# Patient Record
Sex: Female | Born: 1955 | Race: White | Hispanic: No | Smoking: Never smoker
Health system: Southern US, Community
[De-identification: ages and names within clinical notes are randomized; demographics above are authoritative.]

## PROBLEM LIST (undated history)

## (undated) DIAGNOSIS — C801 Malignant (primary) neoplasm, unspecified: Secondary | ICD-10-CM

## (undated) HISTORY — PX: SKIN BIOPSY: SHX1

---

## 2006-07-21 ENCOUNTER — Ambulatory Visit: Payer: Self-pay

## 2010-06-25 ENCOUNTER — Ambulatory Visit: Payer: Self-pay

## 2013-06-26 ENCOUNTER — Ambulatory Visit: Payer: Self-pay | Admitting: Gastroenterology

## 2013-06-27 LAB — PATHOLOGY REPORT

## 2013-07-21 ENCOUNTER — Ambulatory Visit: Payer: Self-pay | Admitting: Unknown Physician Specialty

## 2015-05-28 ENCOUNTER — Other Ambulatory Visit: Payer: Self-pay | Admitting: Unknown Physician Specialty

## 2015-05-28 DIAGNOSIS — R928 Other abnormal and inconclusive findings on diagnostic imaging of breast: Secondary | ICD-10-CM

## 2015-05-31 ENCOUNTER — Ambulatory Visit
Admission: RE | Admit: 2015-05-31 | Discharge: 2015-05-31 | Disposition: A | Discharge status: Home / Self Care | Payer: BC Managed Care – PPO | Source: Ambulatory Visit | Attending: Unknown Physician Specialty | Admitting: Unknown Physician Specialty

## 2015-05-31 DIAGNOSIS — N6002 Solitary cyst of left breast: Secondary | ICD-10-CM | POA: Diagnosis not present

## 2015-05-31 DIAGNOSIS — R928 Other abnormal and inconclusive findings on diagnostic imaging of breast: Secondary | ICD-10-CM

## 2015-05-31 HISTORY — DX: Malignant (primary) neoplasm, unspecified: C80.1

## 2016-06-29 ENCOUNTER — Other Ambulatory Visit: Payer: Self-pay | Admitting: Internal Medicine

## 2016-06-30 ENCOUNTER — Other Ambulatory Visit: Payer: Self-pay | Admitting: Obstetrics and Gynecology

## 2016-06-30 DIAGNOSIS — N6320 Unspecified lump in the left breast, unspecified quadrant: Secondary | ICD-10-CM

## 2016-07-15 ENCOUNTER — Ambulatory Visit
Admission: RE | Admit: 2016-07-15 | Discharge: 2016-07-15 | Disposition: A | Discharge status: Home / Self Care | Payer: BC Managed Care – PPO | Source: Ambulatory Visit | Attending: Obstetrics and Gynecology | Admitting: Obstetrics and Gynecology

## 2016-07-15 DIAGNOSIS — N63 Unspecified lump in breast: Secondary | ICD-10-CM | POA: Insufficient documentation

## 2016-07-15 DIAGNOSIS — N6320 Unspecified lump in the left breast, unspecified quadrant: Secondary | ICD-10-CM

## 2017-06-07 ENCOUNTER — Other Ambulatory Visit: Payer: Self-pay | Admitting: Obstetrics and Gynecology

## 2017-06-07 DIAGNOSIS — Z1231 Encounter for screening mammogram for malignant neoplasm of breast: Secondary | ICD-10-CM

## 2017-07-27 ENCOUNTER — Ambulatory Visit
Admission: RE | Admit: 2017-07-27 | Discharge: 2017-07-27 | Disposition: A | Discharge status: Home / Self Care | Payer: BC Managed Care – PPO | Source: Ambulatory Visit | Attending: Obstetrics and Gynecology | Admitting: Obstetrics and Gynecology

## 2017-07-27 DIAGNOSIS — Z1231 Encounter for screening mammogram for malignant neoplasm of breast: Secondary | ICD-10-CM | POA: Insufficient documentation

## 2017-07-28 ENCOUNTER — Encounter: Payer: Self-pay | Admitting: Obstetrics and Gynecology

## 2017-08-04 ENCOUNTER — Ambulatory Visit: Payer: Self-pay | Admitting: Obstetrics and Gynecology

## 2017-10-11 ENCOUNTER — Encounter: Payer: Self-pay | Admitting: Obstetrics and Gynecology

## 2017-10-11 ENCOUNTER — Ambulatory Visit (INDEPENDENT_AMBULATORY_CARE_PROVIDER_SITE_OTHER): Payer: BC Managed Care – PPO | Admitting: Obstetrics and Gynecology

## 2017-10-11 VITALS — BP 110/70 | Ht 67.0 in | Wt 163.0 lb

## 2017-10-11 DIAGNOSIS — Z1231 Encounter for screening mammogram for malignant neoplasm of breast: Secondary | ICD-10-CM

## 2017-10-11 DIAGNOSIS — L290 Pruritus ani: Secondary | ICD-10-CM | POA: Diagnosis not present

## 2017-10-11 DIAGNOSIS — N952 Postmenopausal atrophic vaginitis: Secondary | ICD-10-CM | POA: Diagnosis not present

## 2017-10-11 DIAGNOSIS — Z01419 Encounter for gynecological examination (general) (routine) without abnormal findings: Secondary | ICD-10-CM

## 2017-10-11 DIAGNOSIS — Z1239 Encounter for other screening for malignant neoplasm of breast: Secondary | ICD-10-CM

## 2017-10-11 NOTE — Progress Notes (Signed)
PCP: Patient, No Pcp Per   Chief Complaint  Patient presents with  . Gynecologic Exam    HPI:      [name] is a 61 y.o. [phone] who LMP was No LMP recorded. Patient is postmenopausal., presents today for her annual examination.  Her menses are absent due to menopause.  She does not have intermenstrual bleeding.  She does have vasomotor sx--occas night sweats.   Sex activity: not sexually active. She does not have vaginal dryness.  Last Pap: May 24, 2015  Results were: no abnormalities /neg HPV DNA.  Hx of STDs: none  Last mammogram: July 27, 2017  Results were: normal--routine follow-up in 12 months There is a FH of breast cancer in her mother, genetic testing not indicated. There is no FH of ovarian cancer. The patient does do self-breast exams.  Colonoscopy: colonoscopy 4 years ago with abnormalities. . Repeat due after 5 years.    Tobacco use: The patient denies current or previous tobacco use. Alcohol use: social drinker Exercise: very active--runs 1/2 marathons  She does get adequate calcium but not Vitamin D in her diet.  She has occas perirectal dryness and itching and uses lotrisone sparingly because too much cream causes bleeding. She is a runner and often in damp underwear.    Past Medical History:  Diagnosis Date  . Cancer [facility])    skin cancer    History reviewed. No pertinent surgical history.  Family History  Problem Relation Age of Onset  . Breast cancer Mother 83  . Skin cancer Mother   . Skin cancer Father   . Throat cancer Maternal Grandfather     Social History   Socioeconomic History  . Marital status: Divorced    Spouse name: Not on file  . Number of children: Not on file  . Years of education: Not on file  . Highest education level: Not on file  Social Needs  . Financial resource strain: Not on file  . Food insecurity - worry: Not on file  . Food insecurity - inability: Not on file  . Transportation needs -  medical: Not on file  . Transportation needs - non-medical: Not on file  Occupational History  . Not on file  Tobacco Use  . Smoking status: Never Smoker  . Smokeless tobacco: Never Used  Substance and Sexual Activity  . Alcohol use: Yes  . Drug use: No  . Sexual activity: No  Other Topics Concern  . Not on file  Social History Narrative  . Not on file    Current Meds  Medication Sig  . triamcinolone (NASACORT ALLERGY 24HR) 55 MCG/ACT AERO nasal inhaler Place into the nose.      ROS:  Review of Systems  Constitutional: Negative for fatigue, fever and unexpected weight change.  Respiratory: Negative for cough, shortness of breath and wheezing.   Cardiovascular: Negative for chest pain, palpitations and leg swelling.  Gastrointestinal: Negative for blood in stool, constipation, diarrhea, nausea and vomiting.  Endocrine: Negative for cold intolerance, heat intolerance and polyuria.  Genitourinary: Negative for dyspareunia, dysuria, flank pain, frequency, genital sores, hematuria, menstrual problem, pelvic pain, urgency, vaginal bleeding, vaginal discharge and vaginal pain.  Musculoskeletal: Negative for back pain, joint swelling and myalgias.  Skin: Negative for rash.  Neurological: Negative for dizziness, syncope, light-headedness, numbness and headaches.  Hematological: Negative for adenopathy.  Psychiatric/Behavioral: Negative for agitation, confusion, sleep disturbance and suicidal ideas. The patient is not nervous/anxious.  Objective: BP 110/70   Ht 5\' 7"  (1.702 m)   Wt 163 lb (73.9 kg)   BMI 25.53 kg/m    Physical Exam  Constitutional: She is oriented to person, place, and time. She appears well-developed and well-nourished.  Genitourinary: Vagina normal and uterus normal. There is no rash or tenderness on the right labia. There is no rash or tenderness on the left labia. No erythema or tenderness in the vagina. No vaginal discharge found. Right adnexum does  not display mass and does not display tenderness. Left adnexum does not display mass and does not display tenderness. Cervix does not exhibit motion tenderness or polyp. Uterus is not enlarged or tender.  Genitourinary Comments: MODERATE VAGINAL AND PERINEAL/PERIANAL ATROPHY OF TISSUE; FEW SUPERFICIAL FISSURE PERINEAL AREA  Neck: Normal range of motion. No thyromegaly present.  Cardiovascular: Normal rate, regular rhythm and normal heart sounds.  No murmur heard. Pulmonary/Chest: Effort normal and breath sounds normal. Right breast exhibits no mass, no nipple discharge, no skin change and no tenderness. Left breast exhibits no mass, no nipple discharge, no skin change and no tenderness.  Abdominal: Soft. There is no tenderness. There is no guarding.  Musculoskeletal: Normal range of motion.  Neurological: She is alert and oriented to person, place, and time. No cranial nerve deficit.  Psychiatric: She has a normal mood and affect. Her behavior is normal.  Vitals reviewed.   Assessment/Plan:  Encounter for annual routine gynecological examination  Screening for breast cancer - Pt is current on mammo (due 8/19). - Plan: MM DIGITAL SCREENING BILATERAL  Vaginal atrophy - Not symptomatic  Perianal itch - Most likely due to moisture from running and atrophy. Try clotrimazole only, coconut oil, keep area dry/desitin or A&D oint. Can try vag ERT ext prn.       GYN counsel breast self exam, mammography screening, adequate intake of calcium and vitamin D, diet and exercise    F/U  Return in about 1 year (around 10/11/2018).  Marquinn Meschke B. Noah Lembke, PA-C 10/11/2017 3:23 PM

## 2017-10-11 NOTE — Patient Instructions (Addendum)
I value your feedback and entrusting us with your care. If you get a [facility] patient survey, I would appreciate you taking the time to let us know about your experience today. Thank you!  [facility]: [phone]    

## 2018-11-17 ENCOUNTER — Ambulatory Visit: Payer: BC Managed Care – PPO | Admitting: Obstetrics and Gynecology

## 2018-12-02 HISTORY — PX: COLONOSCOPY: SHX174

## 2018-12-15 ENCOUNTER — Encounter: Payer: Self-pay | Admitting: Obstetrics and Gynecology

## 2018-12-15 ENCOUNTER — Other Ambulatory Visit (HOSPITAL_COMMUNITY)
Admission: RE | Admit: 2018-12-15 | Discharge: 2018-12-15 | Disposition: A | Discharge status: Home / Self Care | Payer: BC Managed Care – PPO | Source: Ambulatory Visit | Attending: Obstetrics and Gynecology | Admitting: Obstetrics and Gynecology

## 2018-12-15 ENCOUNTER — Ambulatory Visit (INDEPENDENT_AMBULATORY_CARE_PROVIDER_SITE_OTHER): Payer: BC Managed Care – PPO | Admitting: Obstetrics and Gynecology

## 2018-12-15 VITALS — BP 100/78 | HR 56 | Ht 67.0 in | Wt 162.0 lb

## 2018-12-15 DIAGNOSIS — Z01419 Encounter for gynecological examination (general) (routine) without abnormal findings: Secondary | ICD-10-CM | POA: Diagnosis not present

## 2018-12-15 DIAGNOSIS — Z1151 Encounter for screening for human papillomavirus (HPV): Secondary | ICD-10-CM | POA: Diagnosis not present

## 2018-12-15 DIAGNOSIS — N644 Mastodynia: Secondary | ICD-10-CM

## 2018-12-15 DIAGNOSIS — Z124 Encounter for screening for malignant neoplasm of cervix: Secondary | ICD-10-CM | POA: Insufficient documentation

## 2018-12-15 DIAGNOSIS — L290 Pruritus ani: Secondary | ICD-10-CM

## 2018-12-15 DIAGNOSIS — Z1239 Encounter for other screening for malignant neoplasm of breast: Secondary | ICD-10-CM | POA: Diagnosis not present

## 2018-12-15 DIAGNOSIS — Z1211 Encounter for screening for malignant neoplasm of colon: Secondary | ICD-10-CM

## 2018-12-15 MED ORDER — CLOTRIMAZOLE-BETAMETHASONE 1-0.05 % EX CREA: TOPICAL_CREAM | CUTANEOUS | 0 refills | Status: DC

## 2018-12-15 NOTE — Patient Instructions (Addendum)
I value your feedback and entrusting us with your care. If you get a [facility] patient survey, I would appreciate you taking the time to let us know about your experience today. Thank you!  [facility]: [phone]    

## 2018-12-15 NOTE — Progress Notes (Signed)
PCP: Patient, No Pcp Per   Chief Complaint  Patient presents with  . Gynecologic Exam    is having tenderness on right breast, no lump noticed x 1 month    HPI:      Heather Chase is a 63 y.o. [phone] who LMP was No LMP recorded. Patient is postmenopausal., presents today for her annual examination.  Her menses are absent due to menopause.  She does not have intermenstrual bleeding. She does not have vasomotor sx  Sex activity: not sexually active. She does not have vaginal dryness.  Last Pap: May 24, 2015  Results were: no abnormalities /neg HPV DNA.  Hx of STDs: none  Last mammogram: July 27, 2017  Results were: normal--routine follow-up in 12 months. Has had RT outer breast tenderness for the past month without mass. Sx better over past wk however. Pt does drink caffeine.  There is a FH of breast cancer in her mother, genetic testing not indicated. There is no FH of ovarian cancer. The patient does do self-breast exams.  Colonoscopy: colonoscopy 12/02/2018 without abnormalities.  Repeat due after 5 years due to previous hx of polyps.    Tobacco use: The patient denies current or previous tobacco use. Alcohol use: social drinker Exercise: very active--runs 1/2 marathons  She does get adequate calcium and Vitamin D in her diet.  She has occas perirectal dryness and itching and uses lotrisone sparingly because too much cream causes bleeding. She is a runner and often in damp underwear. Uses dryer sheets, scented soap. Discussed A&D/desitin oint last yr to keep area dry.  Labs with PCP  Past Medical History:  Diagnosis Date  . Cancer [facility])    skin cancer    Past Surgical History:  Procedure Laterality Date  . COLONOSCOPY  12/02/2018   pt states results were fine  . SKIN BIOPSY      Family History  Problem Relation Age of Onset  . Breast cancer Mother 67  . Skin cancer Mother   . Arthritis Mother   . Asthma Mother   . Hypertension Mother   . Skin cancer  Father   . Arthritis Father   . Prostate cancer Father   . Throat cancer Maternal Grandfather   . Arthritis Maternal Grandfather   . Hypertension Maternal Grandfather   . Arthritis Maternal Grandmother     Social History   Socioeconomic History  . Marital status: Divorced    Spouse name: Not on file  . Number of children: Not on file  . Years of education: Not on file  . Highest education level: Not on file  Occupational History  . Not on file  Social Needs  . Financial resource strain: Not on file  . Food insecurity:    Worry: Not on file    Inability: Not on file  . Transportation needs:    Medical: Not on file    Non-medical: Not on file  Tobacco Use  . Smoking status: Never Smoker  . Smokeless tobacco: Never Used  Substance and Sexual Activity  . Alcohol use: Yes  . Drug use: No  . Sexual activity: Not Currently    Birth control/protection: Post-menopausal  Lifestyle  . Physical activity:    Days per week: Not on file    Minutes per session: Not on file  . Stress: Not on file  Relationships  . Social connections:    Talks on phone: Not on file    Gets together: Not on file  Attends religious service: Not on file    Active member of club or organization: Not on file    Attends meetings of clubs or organizations: Not on file    Relationship status: Not on file  . Intimate partner violence:    Fear of current or ex partner: Not on file    Emotionally abused: Not on file    Physically abused: Not on file    Forced sexual activity: Not on file  Other Topics Concern  . Not on file  Social History Narrative  . Not on file    Current Meds  Medication Sig  . CETIRIZINE HCL PO Take by mouth.  . Cholecalciferol (VITAMIN D3) 50 MCG (2000 UT) capsule Take by mouth.  . triamcinolone (NASACORT ALLERGY 24HR) 55 MCG/ACT AERO nasal inhaler Place into the nose.      ROS:  Review of Systems  Constitutional: Negative for fatigue, fever and unexpected weight  change.  Respiratory: Negative for cough, shortness of breath and wheezing.   Cardiovascular: Negative for chest pain, palpitations and leg swelling.  Gastrointestinal: Negative for blood in stool, constipation, diarrhea, nausea and vomiting.  Endocrine: Negative for cold intolerance, heat intolerance and polyuria.  Genitourinary: Negative for dyspareunia, dysuria, flank pain, frequency, genital sores, hematuria, menstrual problem, pelvic pain, urgency, vaginal bleeding, vaginal discharge and vaginal pain.  Musculoskeletal: Negative for back pain, joint swelling and myalgias.  Skin: Negative for rash.  Neurological: Negative for dizziness, syncope, light-headedness, numbness and headaches.  Hematological: Negative for adenopathy.  Psychiatric/Behavioral: Negative for agitation, confusion, sleep disturbance and suicidal ideas. The patient is not nervous/anxious.      Objective: BP 100/78   Pulse (!) 56   Ht 5\' 7"  (1.702 m)   Wt 162 lb (73.5 kg)   BMI 25.37 kg/m    Physical Exam Constitutional:      Appearance: She is well-developed.  Genitourinary:     Vulva, vagina, uterus, right adnexa and left adnexa normal.     No vulval lesion or tenderness noted.     No vaginal discharge, erythema or tenderness.     No cervical motion tenderness or polyp.     Uterus is not enlarged or tender.     No right or left adnexal mass present.     Right adnexa not tender.     Left adnexa not tender.     Genitourinary Comments: MILD to MOD VAGINAL AND PERINEAL/PERIANAL ATROPHY OF TISSUE; NO FISSURES; IMPROVING ERYTHEMA PERIANALLY   Neck:     Musculoskeletal: Normal range of motion.     Thyroid: No thyromegaly.  Cardiovascular:     Rate and Rhythm: Normal rate and regular rhythm.     Heart sounds: Normal heart sounds. No murmur.  Pulmonary:     Effort: Pulmonary effort is normal.     Breath sounds: Normal breath sounds.  Chest:     Breasts:        Right: No mass, nipple discharge, skin change  or tenderness.        Left: No mass, nipple discharge, skin change or tenderness.  Abdominal:     Palpations: Abdomen is soft.     Tenderness: There is no abdominal tenderness. There is no guarding.  Musculoskeletal: Normal range of motion.  Neurological:     Mental Status: She is alert and oriented to person, place, and time.     Cranial Nerves: No cranial nerve deficit.  Psychiatric:        Behavior: Behavior normal.  Vitals signs reviewed.     Assessment/Plan:  Encounter for annual routine gynecological examination  Cervical cancer screening - Plan: Cytology - PAP  Screening for HPV (human papillomavirus) - Plan: Cytology - PAP  Screening for breast cancer - Pt to sched mammo.  - Plan: MM 3D SCREEN BREAST BILATERAL  Breast tenderness - Neg exam. Decrease caffeine to see if sx improve.  Screening for colon cancer - Pt had colonoscopy 1/20.   Perianal itch - Rx RF lotrisone crm. Dove sens skin soap/line dry underwear/keep area dry. F/u prn.  - Plan: clotrimazole-betamethasone (LOTRISONE) cream   Meds ordered this encounter  Medications  . clotrimazole-betamethasone (LOTRISONE) cream    Sig: Apply externally BID prn sx up to 2 wks    Dispense:  45 g    Refill:  0    Order Specific Question:   Supervising Provider    Answer:   Gae Dry [[id number]]          GYN counsel breast self exam, mammography screening, adequate intake of calcium and vitamin D, diet and exercise    F/U  Return in about 1 year (around 12/16/2019).  Alicia B. Copland, PA-C 12/15/2018 4:21 PM

## 2018-12-21 LAB — CYTOLOGY - PAP
Diagnosis: NEGATIVE
HPV (WINDOPATH): NOT DETECTED

## 2019-01-03 ENCOUNTER — Ambulatory Visit
Admission: RE | Admit: 2019-01-03 | Discharge: 2019-01-03 | Disposition: A | Discharge status: Home / Self Care | Payer: BC Managed Care – PPO | Source: Ambulatory Visit | Attending: Obstetrics and Gynecology | Admitting: Obstetrics and Gynecology

## 2019-01-03 DIAGNOSIS — Z1239 Encounter for other screening for malignant neoplasm of breast: Secondary | ICD-10-CM | POA: Diagnosis present

## 2019-01-04 ENCOUNTER — Encounter: Payer: Self-pay | Admitting: Obstetrics and Gynecology

## 2019-08-27 IMAGING — MG DIGITAL SCREENING BILATERAL MAMMOGRAM WITH TOMO AND CAD
8 series · 9 of 24 positions shown · non-contrast
Comparison: Previous exam(s).

CLINICAL DATA: Screening.

EXAM:
DIGITAL SCREENING BILATERAL MAMMOGRAM WITH TOMO AND CAD

[L CC synth-2D]
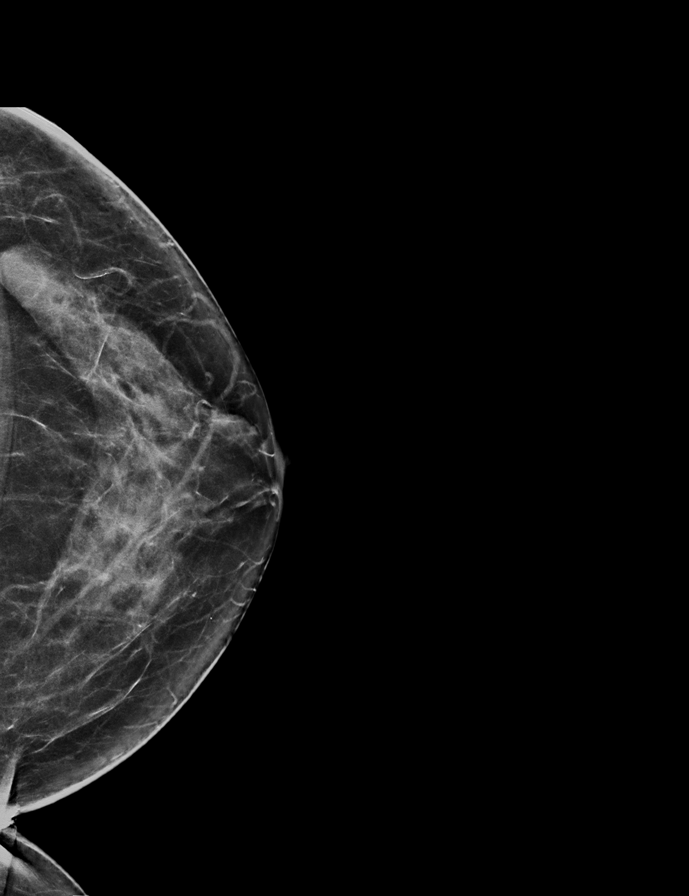

[R MLO synth-2D]
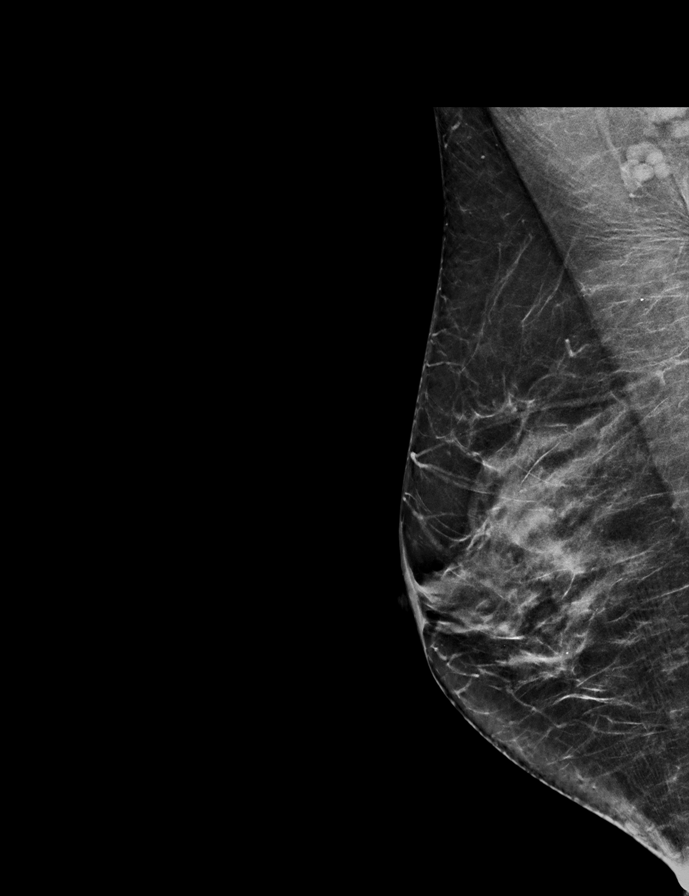

[R CC synth-2D]
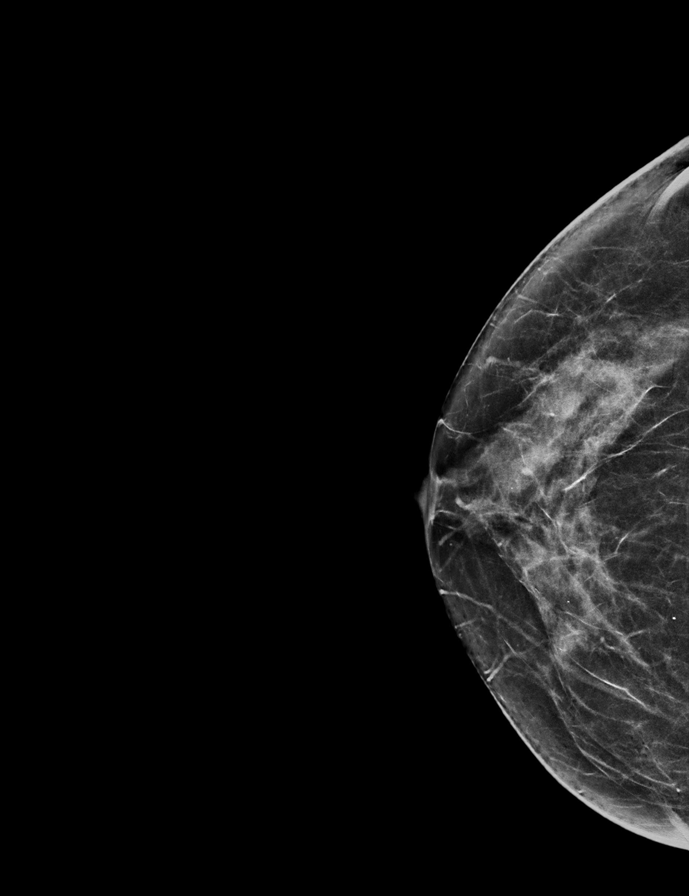

[L MLO synth-2D]
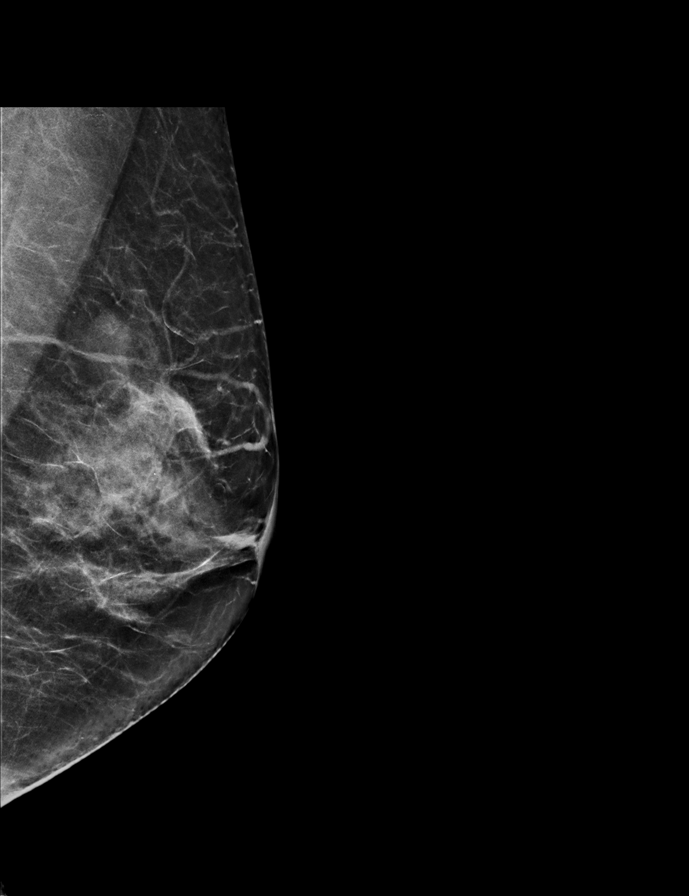

[L CC tomo · 2 of 65 frames shown]
[frame 21/65]
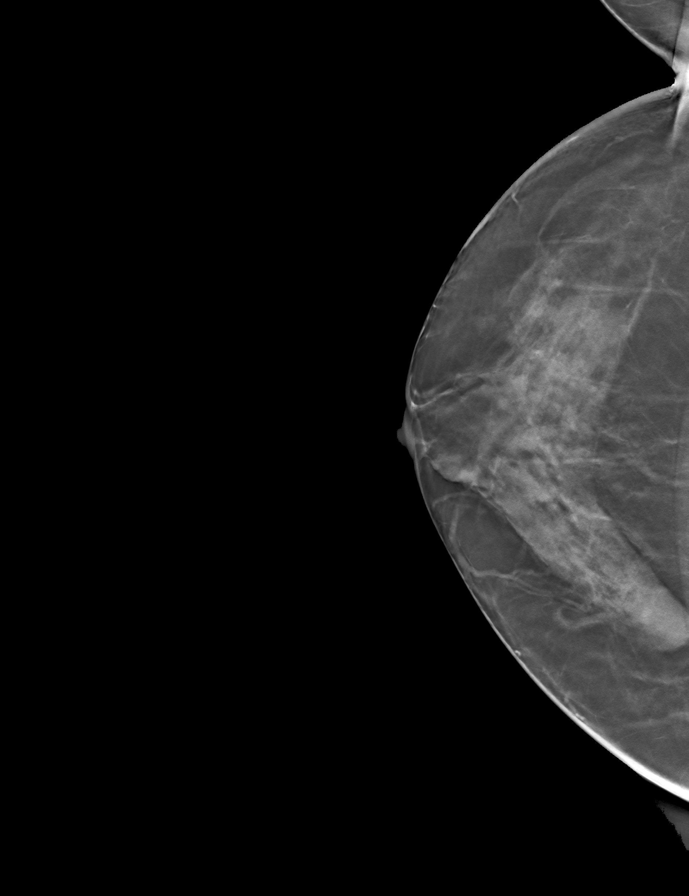
[frame 33/65]
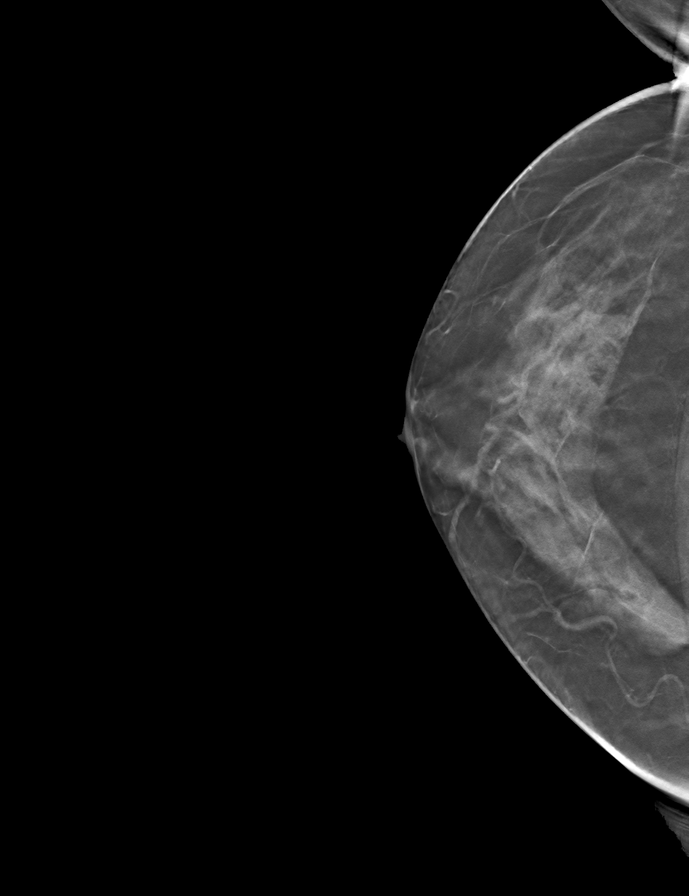

[R CC tomo · tomo slice 33/64.0]
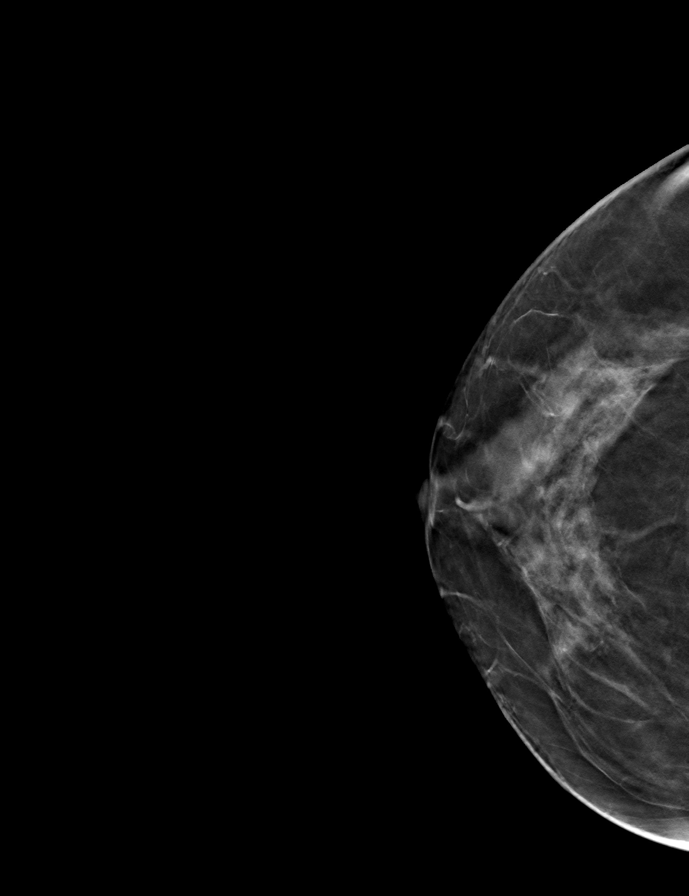

[R MLO tomo · tomo slice 33/65.0]
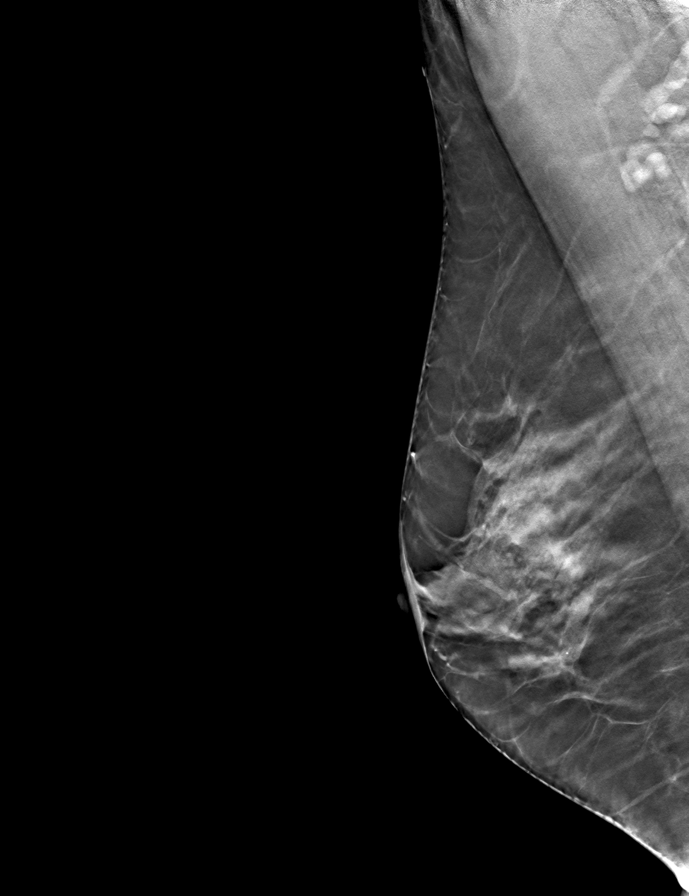

[L MLO tomo · tomo slice 32/63.0]
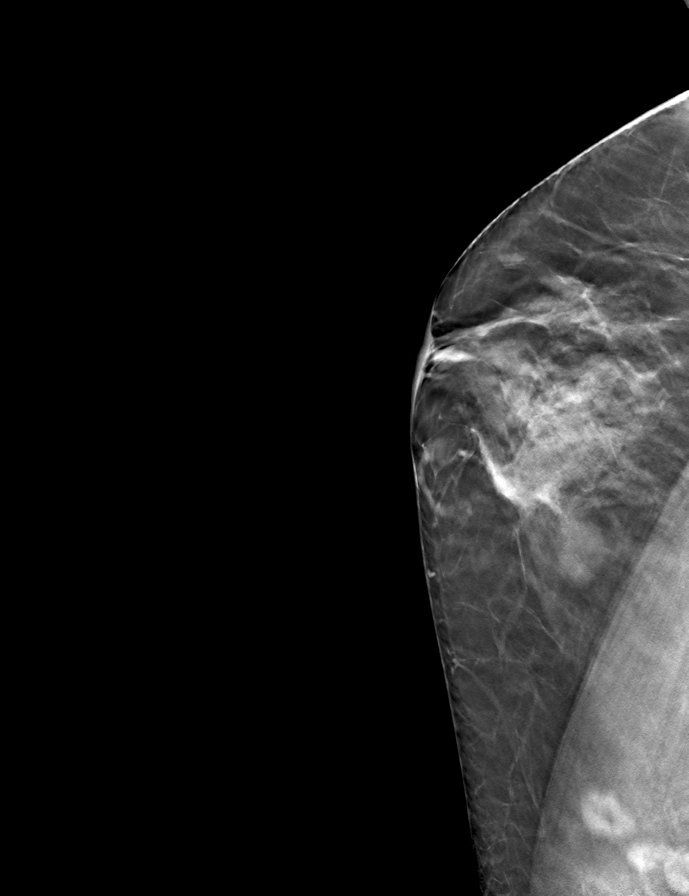

[9 of 24 positions shown; findings below may reference images not displayed]

ACR Breast Density Category c: The breast tissue is heterogeneously
dense, which may obscure small masses.
FINDINGS: There are no findings suspicious for malignancy. Images were
processed with CAD.
IMPRESSION: No mammographic evidence of malignancy. A result letter of this
screening mammogram will be mailed directly to the patient.

RECOMMENDATION:
Screening mammogram in one year. (Code:FT-U-LHB)

BI-RADS CATEGORY  1: Negative.

## 2020-01-29 DIAGNOSIS — G459 Transient cerebral ischemic attack, unspecified: Secondary | ICD-10-CM

## 2020-01-29 HISTORY — DX: Transient cerebral ischemic attack, unspecified: G45.9

## 2020-02-20 ENCOUNTER — Other Ambulatory Visit: Payer: Self-pay

## 2020-02-20 ENCOUNTER — Ambulatory Visit (INDEPENDENT_AMBULATORY_CARE_PROVIDER_SITE_OTHER): Payer: BC Managed Care – PPO | Admitting: Obstetrics and Gynecology

## 2020-02-20 ENCOUNTER — Encounter: Payer: Self-pay | Admitting: Obstetrics and Gynecology

## 2020-02-20 VITALS — BP 100/60 | Ht 67.0 in | Wt 141.0 lb

## 2020-02-20 DIAGNOSIS — N9089 Other specified noninflammatory disorders of vulva and perineum: Secondary | ICD-10-CM | POA: Diagnosis not present

## 2020-02-20 DIAGNOSIS — Z01411 Encounter for gynecological examination (general) (routine) with abnormal findings: Secondary | ICD-10-CM

## 2020-02-20 DIAGNOSIS — Z01419 Encounter for gynecological examination (general) (routine) without abnormal findings: Secondary | ICD-10-CM

## 2020-02-20 DIAGNOSIS — L290 Pruritus ani: Secondary | ICD-10-CM

## 2020-02-20 DIAGNOSIS — N905 Atrophy of vulva: Secondary | ICD-10-CM

## 2020-02-20 DIAGNOSIS — Z1231 Encounter for screening mammogram for malignant neoplasm of breast: Secondary | ICD-10-CM

## 2020-02-20 MED ORDER — CLOTRIMAZOLE-BETAMETHASONE 1-0.05 % EX CREA: TOPICAL_CREAM | CUTANEOUS | 0 refills | Status: DC

## 2020-02-20 NOTE — Patient Instructions (Signed)
I value your feedback and entrusting us with your care. If you get a [facility] patient survey, I would appreciate you taking the time to let us know about your experience today. Thank you!  As of November 09, 2019, your lab results will be released to your MyChart immediately, before I even have a chance to see them. Please give me time to review them and contact you if there are any abnormalities. Thank you for your patience.  

## 2020-02-20 NOTE — Progress Notes (Signed)
PCP: Chad Cordial, PA-C   Chief Complaint  Patient presents with  . Gynecologic Exam    HPI:      Heather Chase is a 64 y.o. [phone] who LMP was No LMP recorded. Patient is postmenopausal., presents today for her annual examination.  Her menses are absent due to menopause.  She does not have intermenstrual bleeding. She ha occas night sweats.  Sex activity: not sexually active. She does not have vaginal dryness.  Last Pap: 12/15/18 Results were: no abnormalities /neg HPV DNA.  Hx of STDs: none  Last mammogram: 01/03/19  Results were: normal--routine follow-up in 12 months.  There is a FH of breast cancer in her mother, genetic testing not indicated. There is no FH of ovarian cancer. The patient does do self-breast exams.  Colonoscopy: colonoscopy 12/02/2018 without abnormalities.  Repeat due after 5 years due to previous hx of polyps.    Tobacco use: The patient denies current or previous tobacco use. Alcohol use: social drinker  No drug use Exercise: very active--runs 1/2 marathons  She does get adequate calcium and Vitamin D in her diet.  She has occas perirectal and vaginal dryness and itching and uses lotrisone sparingly because too much cream causes bleeding. She is a runner and often in damp underwear. Uses dryer sheets, scented soap. Discussed A&D/desitin oint last yr to keep area dry.   Labs with PCP  Past Medical History:  Diagnosis Date  . Cancer [facility])    skin cancer    Past Surgical History:  Procedure Laterality Date  . COLONOSCOPY  12/02/2018   pt states results were fine  . SKIN BIOPSY      Family History  Problem Relation Age of Onset  . Breast cancer Mother 80  . Skin cancer Mother   . Arthritis Mother   . Asthma Mother   . Hypertension Mother   . Skin cancer Father   . Arthritis Father   . Prostate cancer Father   . Throat cancer Maternal Grandfather   . Arthritis Maternal Grandfather   . Hypertension Maternal Grandfather   .  Arthritis Maternal Grandmother     Social History   Socioeconomic History  . Marital status: Divorced    Spouse name: Not on file  . Number of children: Not on file  . Years of education: Not on file  . Highest education level: Not on file  Occupational History  . Not on file  Tobacco Use  . Smoking status: Never Smoker  . Smokeless tobacco: Never Used  Substance and Sexual Activity  . Alcohol use: Yes  . Drug use: No  . Sexual activity: Not Currently    Birth control/protection: Post-menopausal  Other Topics Concern  . Not on file  Social History Narrative  . Not on file   Social Determinants of Health   Financial Resource Strain:   . Difficulty of Paying Living Expenses:   Food Insecurity:   . Worried About Charity fundraiser in the Last Year:   . Arboriculturist in the Last Year:   Transportation Needs:   . Film/video editor (Medical):   Marland Kitchen Lack of Transportation (Non-Medical):   Physical Activity:   . Days of Exercise per Week:   . Minutes of Exercise per Session:   Stress:   . Feeling of Stress :   Social Connections:   . Frequency of Communication with Friends and Family:   . Frequency of Social Gatherings with Friends  and Family:   . Attends Religious Services:   . Active Member of Clubs or Organizations:   . Attends Archivist Meetings:   Marland Kitchen Marital Status:   Intimate Partner Violence:   . Fear of Current or Ex-Partner:   . Emotionally Abused:   Marland Kitchen Physically Abused:   . Sexually Abused:     Current Meds  Medication Sig  . CETIRIZINE HCL PO Take by mouth.  . Cholecalciferol (VITAMIN D3) 50 MCG (2000 UT) capsule Take by mouth.  . clotrimazole-betamethasone (LOTRISONE) cream Apply externally BID prn sx up to 2 wks  . triamcinolone (NASACORT ALLERGY 24HR) 55 MCG/ACT AERO nasal inhaler Place into the nose.  . [DISCONTINUED] clotrimazole-betamethasone (LOTRISONE) cream Apply externally BID prn sx up to 2 wks      ROS:  Review of  Systems  Constitutional: Negative for fatigue, fever and unexpected weight change.  Respiratory: Negative for cough, shortness of breath and wheezing.   Cardiovascular: Negative for chest pain, palpitations and leg swelling.  Gastrointestinal: Negative for blood in stool, constipation, diarrhea, nausea and vomiting.  Endocrine: Negative for cold intolerance, heat intolerance and polyuria.  Genitourinary: Negative for dyspareunia, dysuria, flank pain, frequency, genital sores, hematuria, menstrual problem, pelvic pain, urgency, vaginal bleeding, vaginal discharge and vaginal pain.  Musculoskeletal: Negative for back pain, joint swelling and myalgias.  Skin: Negative for rash.  Neurological: Negative for dizziness, syncope, light-headedness, numbness and headaches.  Hematological: Negative for adenopathy.  Psychiatric/Behavioral: Negative for agitation, confusion, sleep disturbance and suicidal ideas. The patient is not nervous/anxious.      Objective: BP 100/60   Ht 5\' 7"  (1.702 m)   Wt 141 lb (64 kg)   BMI 22.08 kg/m    Physical Exam Constitutional:      Appearance: She is well-developed.  Genitourinary:     Vulva, vagina, cervix, uterus, right adnexa and left adnexa normal.     No vulval lesion or tenderness noted.     Vaginal atrophic mucosa present.     No vaginal discharge, erythema or tenderness.     No cervical polyp.     Uterus is not enlarged or tender.     No right or left adnexal mass present.     Right adnexa not tender.     Left adnexa not tender.     Genitourinary Comments: MILD TO MOD VAGINAL AND PERINEAL/PERIANAL ATROPHY OF TISSUE; FEW FISSURES VAGINALLY AND PERINEAL AREA  Neck:     Thyroid: No thyromegaly.  Cardiovascular:     Rate and Rhythm: Normal rate and regular rhythm.     Heart sounds: Normal heart sounds. No murmur.  Pulmonary:     Effort: Pulmonary effort is normal.     Breath sounds: Normal breath sounds.  Chest:     Breasts:        Right: No  mass, nipple discharge, skin change or tenderness.        Left: No mass, nipple discharge, skin change or tenderness.  Abdominal:     Palpations: Abdomen is soft.     Tenderness: There is no abdominal tenderness. There is no guarding.  Musculoskeletal:        General: Normal range of motion.     Cervical back: Normal range of motion.  Neurological:     General: No focal deficit present.     Mental Status: She is alert and oriented to person, place, and time.     Cranial Nerves: No cranial nerve deficit.  Skin:  General: Skin is warm and dry.  Psychiatric:        Mood and Affect: Mood normal.        Behavior: Behavior normal.        Thought Content: Thought content normal.        Judgment: Judgment normal.  Vitals reviewed.     Assessment/Plan:  Encounter for annual routine gynecological examination  Encounter for screening mammogram for malignant neoplasm of breast - Plan: MM 3D SCREEN BREAST BILATERAL; pt to sched mammo  Perianal itch - Rx RF lotrisone crm. Dove sens skin soap/line dry underwear/keep area dry. F/u prn. If sx persist after recommendations, suspicious for LS although still looks fungal and atrophic. Re-eval next yr/sooner if sx persist/worsen. - Plan: clotrimazole-betamethasone (LOTRISONE) cream   Meds ordered this encounter  Medications  . clotrimazole-betamethasone (LOTRISONE) cream    Sig: Apply externally BID prn sx up to 2 wks    Dispense:  45 g    Refill:  0    Order Specific Question:   Supervising Provider    Answer:   Gae Dry [id number]          GYN counsel breast self exam, mammography screening, adequate intake of calcium and vitamin D, diet and exercise    F/U  Return in about 1 year (around 02/19/2021).  Heather Zuniga B. Verle [place], PA-C 02/20/2020 4:05 PM

## 2020-02-28 ENCOUNTER — Other Ambulatory Visit: Payer: Self-pay

## 2020-02-28 ENCOUNTER — Other Ambulatory Visit: Payer: Self-pay | Admitting: Physician Assistant

## 2020-02-28 ENCOUNTER — Ambulatory Visit
Admission: RE | Admit: 2020-02-28 | Discharge: 2020-02-28 | Disposition: A | Discharge status: Home / Self Care | Payer: BC Managed Care – PPO | Source: Ambulatory Visit | Attending: Physician Assistant | Admitting: Physician Assistant

## 2020-02-28 DIAGNOSIS — G459 Transient cerebral ischemic attack, unspecified: Secondary | ICD-10-CM | POA: Diagnosis not present

## 2020-03-14 ENCOUNTER — Other Ambulatory Visit: Payer: BC Managed Care – PPO

## 2020-04-01 ENCOUNTER — Ambulatory Visit
Admission: RE | Admit: 2020-04-01 | Discharge: 2020-04-01 | Disposition: A | Discharge status: Home / Self Care | Payer: BC Managed Care – PPO | Source: Ambulatory Visit | Attending: Obstetrics and Gynecology | Admitting: Obstetrics and Gynecology

## 2020-04-01 DIAGNOSIS — Z1231 Encounter for screening mammogram for malignant neoplasm of breast: Secondary | ICD-10-CM | POA: Insufficient documentation

## 2020-04-03 ENCOUNTER — Other Ambulatory Visit: Payer: Self-pay | Admitting: Obstetrics and Gynecology

## 2020-04-03 DIAGNOSIS — R928 Other abnormal and inconclusive findings on diagnostic imaging of breast: Secondary | ICD-10-CM

## 2020-04-15 ENCOUNTER — Ambulatory Visit
Admission: RE | Admit: 2020-04-15 | Discharge: 2020-04-15 | Disposition: A | Discharge status: Home / Self Care | Payer: BC Managed Care – PPO | Source: Ambulatory Visit | Attending: Obstetrics and Gynecology | Admitting: Obstetrics and Gynecology

## 2020-04-15 DIAGNOSIS — R928 Other abnormal and inconclusive findings on diagnostic imaging of breast: Secondary | ICD-10-CM

## 2020-04-16 ENCOUNTER — Encounter: Payer: Self-pay | Admitting: Obstetrics and Gynecology

## 2021-03-20 ENCOUNTER — Other Ambulatory Visit: Payer: Self-pay | Admitting: Obstetrics and Gynecology

## 2021-03-20 DIAGNOSIS — Z1231 Encounter for screening mammogram for malignant neoplasm of breast: Secondary | ICD-10-CM

## 2021-04-22 ENCOUNTER — Ambulatory Visit
Admission: RE | Admit: 2021-04-22 | Discharge: 2021-04-22 | Disposition: A | Discharge status: Home / Self Care | Payer: BC Managed Care – PPO | Source: Ambulatory Visit | Attending: Obstetrics and Gynecology | Admitting: Obstetrics and Gynecology

## 2021-04-22 ENCOUNTER — Other Ambulatory Visit: Payer: Self-pay

## 2021-04-22 DIAGNOSIS — Z1231 Encounter for screening mammogram for malignant neoplasm of breast: Secondary | ICD-10-CM

## 2021-07-03 ENCOUNTER — Encounter: Payer: Self-pay | Admitting: Obstetrics and Gynecology

## 2021-07-07 ENCOUNTER — Ambulatory Visit: Payer: BC Managed Care – PPO | Admitting: Obstetrics

## 2021-07-07 ENCOUNTER — Encounter: Payer: Self-pay | Admitting: Obstetrics

## 2021-07-07 ENCOUNTER — Other Ambulatory Visit: Payer: Self-pay

## 2021-07-07 VITALS — BP 90/60 | Ht 67.0 in | Wt 143.0 lb

## 2021-07-07 DIAGNOSIS — L290 Pruritus ani: Secondary | ICD-10-CM

## 2021-07-07 MED ORDER — NYSTATIN-TRIAMCINOLONE 100000-0.1 UNIT/GM-% EX CREA: 1.0000 "application " | TOPICAL_CREAM | Freq: Two times a day (BID) | CUTANEOUS | 1 refills | Status: DC

## 2021-07-07 NOTE — Progress Notes (Signed)
Obstetrics & Gynecology Office Visit   Chief Complaint:  Chief Complaint  Patient presents with   Vaginal Itching    And anal itching x few years    History of Present Illness: Heather Chase present s for evaluation of ongoing perianal itching she has been bothered by in the past. She last saw Tawny Hopping in March of 2021 for an annual and was precribed Lotrimin cream for this complaint. She reports that the itching is worse and she returns to focus on treatment.  She is training for a full marathon, and the exercise and sweating , she believes, contributes to her symptoms.   Review of Systems:  Review of Systems  Constitutional: Negative.   Respiratory: Negative.    Cardiovascular: Negative.   Gastrointestinal: Negative.   Genitourinary: Negative.   Musculoskeletal: Negative.   Skin:  Positive for itching.       Describes whitish itchy areas around her vagina and anus.  Neurological: Negative.   Endo/Heme/Allergies: Negative.   Psychiatric/Behavioral: Negative.      Past Medical History:  Past Medical History:  Diagnosis Date   Cancer ([place])    skin cancer   TIA (transient ischemic attack) 01/2020    Past Surgical History:  Past Surgical History:  Procedure Laterality Date   COLONOSCOPY  12/02/2018   pt states results were fine   SKIN BIOPSY      Gynecologic History: No LMP recorded. Patient is postmenopausal.  Obstetric [id number]  Family History:  Family History  Problem Relation Age of Onset   Breast cancer Mother 35   Skin cancer Mother    Arthritis Mother    Asthma Mother    Hypertension Mother    Skin cancer Father    Arthritis Father    Prostate cancer Father    Throat cancer Maternal Grandfather    Arthritis Maternal Grandfather    Hypertension Maternal Grandfather    Arthritis Maternal Grandmother     Social History:  Social History   Socioeconomic History   Marital status: Divorced    Spouse name: Not on file   Number of  children: Not on file   Years of education: Not on file   Highest education level: Not on file  Occupational History   Not on file  Tobacco Use   Smoking status: Never   Smokeless tobacco: Never  Vaping Use   Vaping Use: Never used  Substance and Sexual Activity   Alcohol use: Yes   Drug use: No   Sexual activity: Not Currently    Birth control/protection: Post-menopausal  Other Topics Concern   Not on file  Social History Narrative   Not on file   Social Determinants of Health   Financial Resource Strain: Not on file  Food Insecurity: Not on file  Transportation Needs: Not on file  Physical Activity: Not on file  Stress: Not on file  Social Connections: Not on file  Intimate Partner Violence: Not on file    Allergies:  No Known Allergies  Medications: Prior to Admission medications   Medication Sig Start Date End Date Taking? Authorizing Provider  aspirin 81 MG EC tablet Take by mouth.   Yes [provider]  CETIRIZINE HCL PO Take by mouth.   Yes [provider]  Cholecalciferol (VITAMIN D3) 50 MCG (2000 UT) capsule Take by mouth.   Yes [provider]  clotrimazole-betamethasone (LOTRISONE) cream Apply externally BID prn sx up to 2 wks [id number]  Yes Copland, Deirdre Evener, PA-C  fluticasone (FLONASE) 50 MCG/ACT nasal spray Place into the nose.   Yes [provider]  nystatin-triamcinolone (MYCOLOG II) cream Apply 1 application topically 2 (two) times daily. 07/07/21  Yes Imagene Riches, CNM  rosuvastatin (CRESTOR) 10 MG tablet Take 10 mg by mouth daily. 05/20/21  Yes [provider]  scopolamine (TRANSDERM-SCOP) 1 MG/3DAYS SMARTSIG:Topical 06/25/21  Yes [provider]    Physical Exam Vitals:  Vitals:   07/07/21 1326  BP: 90/60   No LMP recorded. Patient is postmenopausal.  Physical Exam Constitutional:      Appearance: Normal appearance. She is normal weight.  HENT:     Head: Normocephalic and atraumatic.   Cardiovascular:     Rate and Rhythm: Normal rate and regular rhythm.  Pulmonary:     Effort: Pulmonary effort is normal.     Breath sounds: Normal breath sounds.  Abdominal:     General: Abdomen is flat.     Palpations: Abdomen is soft.  Genitourinary:    General: Normal vulva.       Comments: Hair normally distributed. Some labial atrophy noted. Cystocele visible and rectocel noted. Along her left labia minora, whitish discoloration of skin noted. Same noted perianally and perineally- irregular bordered , white , slightly raised skin noted. Musculoskeletal:        General: Normal range of motion.     Cervical back: Normal range of motion and neck supple.  Skin:    General: Skin is warm and dry.  Neurological:     General: No focal deficit present.     Mental Status: She is alert and oriented to person, place, and time.  Psychiatric:        Mood and Affect: Mood normal.        Behavior: Behavior normal.     Assessment: 65 y.o. [id number] ? Perianal yeast, ro likely Lichen Sclerosis   Plan: Problem List Items Addressed This Visit       Musculoskeletal and Integument   Perianal itch - Primary   Relevant Medications   nystatin-triamcinolone (MYCOLOG II) cream  Appointment made with one of the Mds to evaluate for LS.  Rx for Mycolog in the meantime.  Imagene Riches, CNM  07/07/2021 2:17 PM

## 2021-08-05 ENCOUNTER — Ambulatory Visit: Payer: BC Managed Care – PPO | Admitting: Obstetrics and Gynecology

## 2021-08-05 NOTE — Progress Notes (Addendum)
PCP: Chad Cordial, PA-C   Chief Complaint  Patient presents with   Gynecologic Exam    Night sweats and hot flashes    HPI:      [name] is a 65 y.o. [phone] who LMP was No LMP recorded. Patient is postmenopausal., presents today for her annual examination.  Her menses are absent due to menopause.  She does not have PMB. She had occas night sweats but they have increased recently. Pt under increased stress training for marathon 11/22. Has hx of elevated LFTs, followed by PCP; no recent thyroid check.  Sex activity: not sexually active. She does not have vaginal dryness.  Last Pap: 12/15/18 Results were: no abnormalities /neg HPV DNA.  Hx of STDs: none  Last mammogram: 04/19/21  Results were: normal--routine follow-up in 12 months.  There is a FH of breast cancer in her mother, genetic testing not indicated. There is no FH of ovarian cancer. The patient does do self-breast exams.  Colonoscopy: colonoscopy 12/02/2018 without abnormalities.  Repeat due after 5 years due to previous hx of polyps.   DEXA: never, pt interested  Tobacco use: The patient denies current or previous tobacco use. Alcohol use: social drinker  No drug use Exercise: very active--runs 1/2 and full marathons  She does get adequate calcium and Vitamin D in her diet.  She has hx of perirectal and vaginal dryness/itching; treated with lotrisone sparingly in past (too much cream caused bleeding). She is a runner and often in damp underwear.  Discussed A&D/desitin oint in past to keep area dry. Lots of irritation 8/22 and given mycolog crm by [facility]; using daily with sx relief. Concern for LS when last eval 3/21.   Labs with PCP  Past Medical History:  Diagnosis Date   Cancer ([place])    skin cancer   TIA (transient ischemic attack) 01/2020    Past Surgical History:  Procedure Laterality Date   COLONOSCOPY  12/02/2018   pt states results were fine   SKIN BIOPSY      Family History  Problem  Relation Age of Onset   Breast cancer Mother 54   Skin cancer Mother    Arthritis Mother    Asthma Mother    Hypertension Mother    Skin cancer Father    Arthritis Father    Prostate cancer Father    Throat cancer Maternal Grandfather    Arthritis Maternal Grandfather    Hypertension Maternal Grandfather    Arthritis Maternal Grandmother     Social History   Socioeconomic History   Marital status: Divorced    Spouse name: Not on file   Number of children: Not on file   Years of education: Not on file   Highest education level: Not on file  Occupational History   Not on file  Tobacco Use   Smoking status: Never   Smokeless tobacco: Never  Vaping Use   Vaping Use: Never used  Substance and Sexual Activity   Alcohol use: Yes   Drug use: No   Sexual activity: Not Currently    Birth control/protection: Post-menopausal  Other Topics Concern   Not on file  Social History Narrative   Not on file   Social Determinants of Health   Financial Resource Strain: Not on file  Food Insecurity: Not on file  Transportation Needs: Not on file  Physical Activity: Not on file  Stress: Not on file  Social Connections: Not on file  Intimate Partner Violence: Not  on file    Current Meds  Medication Sig   albuterol (VENTOLIN HFA) 108 (90 Base) MCG/ACT inhaler Inhale into the lungs.   aspirin 81 MG EC tablet Take by mouth.   CETIRIZINE HCL PO Take by mouth.   Cholecalciferol (VITAMIN D3) 50 MCG (2000 UT) capsule Take by mouth.   fluticasone (FLONASE) 50 MCG/ACT nasal spray Place into the nose.   nystatin-triamcinolone (MYCOLOG II) cream Apply 1 application topically 2 (two) times daily.   rosuvastatin (CRESTOR) 10 MG tablet Take 10 mg by mouth daily.      ROS:  Review of Systems  Constitutional:  Negative for fatigue, fever and unexpected weight change.  Respiratory:  Negative for cough, shortness of breath and wheezing.   Cardiovascular:  Negative for chest pain,  palpitations and leg swelling.  Gastrointestinal:  Negative for blood in stool, constipation, diarrhea, nausea and vomiting.  Endocrine: Negative for cold intolerance, heat intolerance and polyuria.  Genitourinary:  Negative for dyspareunia, dysuria, flank pain, frequency, genital sores, hematuria, menstrual problem, pelvic pain, urgency, vaginal bleeding, vaginal discharge and vaginal pain.  Musculoskeletal:  Negative for back pain, joint swelling and myalgias.  Skin:  Negative for rash.  Neurological:  Negative for dizziness, syncope, light-headedness, numbness and headaches.  Hematological:  Negative for adenopathy.  Psychiatric/Behavioral:  Negative for agitation, confusion, sleep disturbance and suicidal ideas. The patient is not nervous/anxious.     Objective: BP 100/60   Ht '5\' 7"'$  (1.702 m)   Wt 144 lb (65.3 kg)   BMI 22.55 kg/m    Physical Exam Constitutional:      Appearance: She is well-developed.  Genitourinary:     Vulva normal.     Genitourinary Comments: MILD TO MOD VAGINAL AND PERINEAL/PERIANAL ATROPHY OF TISSUE; FEW FISSURES VAGINALLY AND PERINEAL AREA     Right Labia: No rash, tenderness or lesions.    Left Labia: No tenderness, lesions or rash.       No vaginal discharge, erythema or tenderness.      Right Adnexa: not tender and no mass present.    Left Adnexa: not tender and no mass present.    No cervical friability or polyp.     Uterus is not enlarged or tender.  Breasts:    Right: No mass, nipple discharge, skin change or tenderness.     Left: No mass, nipple discharge, skin change or tenderness.  Neck:     Thyroid: No thyromegaly.  Cardiovascular:     Rate and Rhythm: Normal rate and regular rhythm.     Heart sounds: Normal heart sounds. No murmur heard. Pulmonary:     Effort: Pulmonary effort is normal.     Breath sounds: Normal breath sounds.  Abdominal:     Palpations: Abdomen is soft.     Tenderness: There is no abdominal tenderness. There is  no guarding or rebound.  Musculoskeletal:        General: Normal range of motion.     Cervical back: Normal range of motion.  Lymphadenopathy:     Cervical: No cervical adenopathy.  Neurological:     General: No focal deficit present.     Mental Status: She is alert and oriented to person, place, and time.     Cranial Nerves: No cranial nerve deficit.  Skin:    General: Skin is warm and dry.  Psychiatric:        Mood and Affect: Mood normal.        Behavior: Behavior normal.  Thought Content: Thought content normal.        Judgment: Judgment normal.  Vitals reviewed.    Assessment/Plan: Encounter for annual routine gynecological examination  Encounter for screening mammogram for malignant neoplasm of breast - Plan: MM 3D SCREEN BREAST BILATERAL; pt current on mammo, can sched 5/22  Perianal itch--sx improved with mycolog crm. Will call for RF prn. No evid LS at this time. Will cont to follow next yr. Keep area dry.   Screening for osteoporosis - Plan: [facility] Bone Density; DEXA order placed, pt to call [facility] to sched. Cont ca/vit D.   Vasomotor symptoms--most likely not due to menopause since no sx for many yrs. Question stress related. Has upcoming labs with PCP. Wouldn't do HRT at her age and hx of TIA.       GYN counsel breast self exam, mammography screening, adequate intake of calcium and vitamin D, diet and exercise    F/U  Return in about 1 year (around 08/06/2022).  Casandra Dallaire B. Treyvone Chelf, PA-C 08/06/2021 10:32 AM

## 2021-08-06 ENCOUNTER — Ambulatory Visit (INDEPENDENT_AMBULATORY_CARE_PROVIDER_SITE_OTHER): Payer: Medicare PPO | Admitting: Obstetrics and Gynecology

## 2021-08-06 ENCOUNTER — Other Ambulatory Visit: Payer: Self-pay

## 2021-08-06 ENCOUNTER — Encounter: Payer: Self-pay | Admitting: Obstetrics and Gynecology

## 2021-08-06 VITALS — BP 100/60 | Ht 67.0 in | Wt 144.0 lb

## 2021-08-06 DIAGNOSIS — L290 Pruritus ani: Secondary | ICD-10-CM | POA: Diagnosis not present

## 2021-08-06 DIAGNOSIS — Z13820 Encounter for screening for osteoporosis: Secondary | ICD-10-CM | POA: Diagnosis not present

## 2021-08-06 DIAGNOSIS — Z01419 Encounter for gynecological examination (general) (routine) without abnormal findings: Secondary | ICD-10-CM

## 2021-08-06 DIAGNOSIS — Z1231 Encounter for screening mammogram for malignant neoplasm of breast: Secondary | ICD-10-CM | POA: Diagnosis not present

## 2021-08-06 NOTE — Patient Instructions (Addendum)
I value your feedback and you entrusting us with your care. If you get a [facility] patient survey, I would appreciate you taking the time to let us know about your experience today. Thank you!  [facility]: [phone]      

## 2021-08-13 ENCOUNTER — Other Ambulatory Visit: Payer: Self-pay

## 2021-08-13 ENCOUNTER — Ambulatory Visit
Admission: RE | Admit: 2021-08-13 | Discharge: 2021-08-13 | Disposition: A | Discharge status: Home / Self Care | Payer: Medicare PPO | Source: Ambulatory Visit | Attending: Obstetrics and Gynecology | Admitting: Obstetrics and Gynecology

## 2021-08-13 DIAGNOSIS — Z780 Asymptomatic menopausal state: Secondary | ICD-10-CM | POA: Insufficient documentation

## 2021-08-13 DIAGNOSIS — Z13820 Encounter for screening for osteoporosis: Secondary | ICD-10-CM | POA: Insufficient documentation

## 2021-08-13 DIAGNOSIS — M85851 Other specified disorders of bone density and structure, right thigh: Secondary | ICD-10-CM | POA: Diagnosis not present

## 2021-08-14 ENCOUNTER — Encounter: Payer: Self-pay | Admitting: Obstetrics and Gynecology

## 2022-02-24 ENCOUNTER — Other Ambulatory Visit: Payer: Self-pay | Admitting: Obstetrics and Gynecology

## 2022-08-04 ENCOUNTER — Other Ambulatory Visit: Payer: Self-pay | Admitting: Internal Medicine

## 2022-08-04 ENCOUNTER — Other Ambulatory Visit: Payer: Self-pay | Admitting: Obstetrics and Gynecology

## 2022-08-04 DIAGNOSIS — Z1231 Encounter for screening mammogram for malignant neoplasm of breast: Secondary | ICD-10-CM

## 2022-08-27 ENCOUNTER — Ambulatory Visit
Admission: RE | Admit: 2022-08-27 | Discharge: 2022-08-27 | Disposition: A | Discharge status: Home / Self Care | Payer: Medicare PPO | Source: Ambulatory Visit | Attending: Internal Medicine | Admitting: Internal Medicine

## 2022-08-27 DIAGNOSIS — Z1231 Encounter for screening mammogram for malignant neoplasm of breast: Secondary | ICD-10-CM | POA: Diagnosis present

## 2022-08-28 ENCOUNTER — Other Ambulatory Visit: Payer: Self-pay | Admitting: Internal Medicine

## 2022-08-28 DIAGNOSIS — R928 Other abnormal and inconclusive findings on diagnostic imaging of breast: Secondary | ICD-10-CM

## 2022-08-28 DIAGNOSIS — N6489 Other specified disorders of breast: Secondary | ICD-10-CM

## 2022-09-16 ENCOUNTER — Ambulatory Visit
Admission: RE | Admit: 2022-09-16 | Discharge: 2022-09-16 | Disposition: A | Discharge status: Home / Self Care | Payer: Medicare PPO | Source: Ambulatory Visit | Attending: Internal Medicine | Admitting: Internal Medicine

## 2022-09-16 DIAGNOSIS — R928 Other abnormal and inconclusive findings on diagnostic imaging of breast: Secondary | ICD-10-CM | POA: Insufficient documentation

## 2022-09-16 DIAGNOSIS — N6489 Other specified disorders of breast: Secondary | ICD-10-CM

## 2022-11-25 ENCOUNTER — Ambulatory Visit
Admission: EM | Admit: 2022-11-25 | Discharge: 2022-11-25 | Disposition: A | Discharge status: Home / Self Care | Payer: Medicare PPO | Attending: Urgent Care | Admitting: Urgent Care

## 2022-11-25 DIAGNOSIS — N3001 Acute cystitis with hematuria: Secondary | ICD-10-CM | POA: Diagnosis not present

## 2022-11-25 LAB — POCT URINALYSIS DIP (MANUAL ENTRY)
Bilirubin, UA: NEGATIVE
Glucose, UA: NEGATIVE mg/dL
Ketones, POC UA: NEGATIVE mg/dL
Nitrite, UA: NEGATIVE
Protein Ur, POC: >=300 mg/dL — AB
Spec Grav, UA: >=1.03 — AB (ref 1.010–1.025)
Urobilinogen, UA: 0.2 E.U./dL
pH, UA: 5.5 (ref 5.0–8.0)

## 2022-11-25 MED ORDER — NITROFURANTOIN MONOHYD MACRO 100 MG PO CAPS: 100.0000 mg | ORAL_CAPSULE | Freq: Two times a day (BID) | ORAL | 0 refills | Status: DC

## 2022-11-25 NOTE — ED Triage Notes (Signed)
Pt. Presents to UC w/ c/o urinary frequency and fullness since yesterday.

## 2022-11-25 NOTE — ED Provider Notes (Signed)
[facility]    CSN: [id number] Arrival date & time: 11/25/22  1431      History   Chief Complaint Chief Complaint  Patient presents with   Urinary Frequency    HPI Heather Chase is a 66 y.o. female.    Urinary Frequency    Urgent care with complaint of urinary frequency and fullness since yesterday.  She denies fever.  Denies back pain.  Denies abdominal pain.  Past Medical History:  Diagnosis Date   Cancer [facility])    skin cancer   TIA (transient ischemic attack) 01/2020    Patient Active Problem List   Diagnosis Date Noted   Perianal itch 10/11/2017   Vaginal atrophy 10/11/2017    Past Surgical History:  Procedure Laterality Date   COLONOSCOPY  12/02/2018   pt states results were fine   SKIN BIOPSY      OB History     Gravida  4   Para  3   Term  3   Preterm      AB  1   Living  3      SAB  1   IAB      Ectopic      Multiple      Live Births               Home Medications    Prior to Admission medications   Medication Sig Start Date End Date Taking? Authorizing Provider  albuterol (VENTOLIN HFA) 108 (90 Base) MCG/ACT inhaler Inhale into the lungs. 07/16/21 07/16/22  [provider]  aspirin 81 MG EC tablet Take by mouth.    [provider]  CETIRIZINE HCL PO Take by mouth.    [provider]  Cholecalciferol (VITAMIN D3) 50 MCG (2000 UT) capsule Take by mouth.    [provider]  fluticasone (FLONASE) 50 MCG/ACT nasal spray Place into the nose.    [provider]  nystatin-triamcinolone (MYCOLOG II) cream Apply 1 application topically 2 (two) times daily. 07/07/21   Imagene Riches, CNM  rosuvastatin (CRESTOR) 10 MG tablet Take 10 mg by mouth daily. 05/20/21   [provider]    Family History Family History  Problem Relation Age of Onset   Breast cancer Mother 56   Skin cancer Mother    Arthritis Mother    Asthma Mother    Hypertension Mother    Skin  cancer Father    Arthritis Father    Prostate cancer Father    Throat cancer Maternal Grandfather    Arthritis Maternal Grandfather    Hypertension Maternal Grandfather    Arthritis Maternal Grandmother     Social History Social History   Tobacco Use   Smoking status: Never   Smokeless tobacco: Never  Vaping Use   Vaping Use: Never used  Substance Use Topics   Alcohol use: Yes   Drug use: No     Allergies   Patient has no known allergies.   Review of Systems Review of Systems  Genitourinary:  Positive for frequency.     Physical Exam Triage Vital Signs ED Triage Vitals  Enc Vitals Group     BP 11/25/22 1600 110/69     Pulse Rate 11/25/22 1600 72     Resp 11/25/22 1600 16     Temp 11/25/22 1600 98.1 F (36.7 C)     Temp src --      SpO2 11/25/22 1600 97 %     Weight --  Height --      Head Circumference --      Peak Flow --      Pain Score 11/25/22 1601 0     Pain Loc --      Pain Edu? --      Excl. in [place]? --    No data found.  Updated Vital Signs BP 110/69   Pulse 72   Temp 98.1 F (36.7 C)   Resp 16   SpO2 97%   Visual Acuity Right Eye Distance:   Left Eye Distance:   Bilateral Distance:    Right Eye Near:   Left Eye Near:    Bilateral Near:     Physical Exam Vitals reviewed.  Constitutional:      Appearance: Normal appearance.  Neurological:     General: No focal deficit present.     Mental Status: She is alert and oriented to person, place, and time.  Psychiatric:        Mood and Affect: Mood normal.        Behavior: Behavior normal.      UC Treatments / Results  Labs (all labs ordered are listed, but only abnormal results are displayed) Labs Reviewed  POCT URINALYSIS DIP (MANUAL ENTRY) - Abnormal; Notable for the following components:      Result Value   Clarity, UA cloudy (*)    Spec Grav, UA >=1.030 (*)    Blood, UA large (*)    Protein Ur, POC >=300 (*)    Leukocytes, UA Large (3+) (*)    All other components  within normal limits    EKG   Radiology No results found.  Procedures Procedures (including critical care time)  Medications Ordered in UC Medications - No data to display  Initial Impression / Assessment and Plan / UC Course  I have reviewed the triage vital signs and the nursing notes.  Pertinent labs & imaging results that were available during my care of the patient were reviewed by me and considered in my medical decision making (see chart for details).   UA shows large leukocytes and large blood present.  Urine is cloudy.  Will treat for acute cystitis with hematuria.  Are ordered to confirm susceptibility.   Final Clinical Impressions(s) / UC Diagnoses   Final diagnoses:  None   Discharge Instructions   None    ED Prescriptions   None    PDMP not reviewed this encounter.   Rose Phi, [place] 11/25/22 1610

## 2022-11-25 NOTE — Discharge Instructions (Signed)
Follow up here or with your primary care provider if your symptoms are worsening or not improving with treatment.     

## 2022-11-27 LAB — URINE CULTURE: Culture: 80000 — AB

## 2022-12-28 NOTE — Progress Notes (Deleted)
PCP: Kirk Ruths, MD   No chief complaint on file.   HPI:      [name] is a 67 y.o. [phone] who LMP was No LMP recorded. Patient is postmenopausal., presents today for her Medicare annual examination.  Her menses are absent due to menopause.  She does not have PMB. She had occas night sweats but they have increased recently. Pt under increased stress training for marathon 11/22. Has hx of elevated LFTs, followed by PCP; no recent thyroid check.  Sex activity: not sexually active. She does not have vaginal dryness.  Last Pap: 12/15/18 Results were: no abnormalities /neg HPV DNA.  Hx of STDs: none  Last mammogram: 09/16/22  Results were: normal after addl views LT breast--routine follow-up in 12 months.  There is a FH of breast cancer in her mother, genetic testing not indicated. There is no FH of ovarian cancer. The patient does do self-breast exams.  Colonoscopy: colonoscopy 12/02/2018 without abnormalities.  Repeat due after 5 years due to previous hx of polyps.   [facility]A: 9/22 at [facility], normal spine, osteopenia in hip  Tobacco use: The patient denies current or previous tobacco use. Alcohol use: social drinker  No drug use Exercise: very active--runs 1/2 and full marathons  She does get adequate calcium and Vitamin D in her diet.  She has hx of perirectal and vaginal dryness/itching; treated with lotrisone sparingly in past (too much cream caused bleeding). She is a runner and often in damp underwear.  Discussed A&D/desitin oint in past to keep area dry. Lots of irritation 8/22 and given mycolog crm by MMF; using daily with sx relief. Concern for LS when last eval 3/21.   Labs with PCP  Past Medical History:  Diagnosis Date   Cancer ([place])    skin cancer   TIA (transient ischemic attack) 01/2020    Past Surgical History:  Procedure Laterality Date   COLONOSCOPY  12/02/2018   pt states results were fine   SKIN BIOPSY      Family History  Problem  Relation Age of Onset   Breast cancer Mother 8   Skin cancer Mother    Arthritis Mother    Asthma Mother    Hypertension Mother    Skin cancer Father    Arthritis Father    Prostate cancer Father    Throat cancer Maternal Grandfather    Arthritis Maternal Grandfather    Hypertension Maternal Grandfather    Arthritis Maternal Grandmother     Social History   Socioeconomic History   Marital status: Divorced    Spouse name: Not on file   Number of children: Not on file   Years of education: Not on file   Highest education level: Not on file  Occupational History   Not on file  Tobacco Use   Smoking status: Never   Smokeless tobacco: Never  Vaping Use   Vaping Use: Never used  Substance and Sexual Activity   Alcohol use: Yes   Drug use: No   Sexual activity: Not Currently    Birth control/protection: Post-menopausal  Other Topics Concern   Not on file  Social History Narrative   Not on file   Social Determinants of Health   Financial Resource Strain: Not on file  Food Insecurity: Not on file  Transportation Needs: Not on file  Physical Activity: Not on file  Stress: Not on file  Social Connections: Not on file  Intimate Partner Violence: Not on file  No outpatient medications have been marked as taking for the 12/29/22 encounter (Appointment) with Farrah Skoda, Deirdre Evener, PA-C.      ROS:  Review of Systems  Constitutional:  Negative for fatigue, fever and unexpected weight change.  Respiratory:  Negative for cough, shortness of breath and wheezing.   Cardiovascular:  Negative for chest pain, palpitations and leg swelling.  Gastrointestinal:  Negative for blood in stool, constipation, diarrhea, nausea and vomiting.  Endocrine: Negative for cold intolerance, heat intolerance and polyuria.  Genitourinary:  Negative for dyspareunia, dysuria, flank pain, frequency, genital sores, hematuria, menstrual problem, pelvic pain, urgency, vaginal bleeding, vaginal discharge  and vaginal pain.  Musculoskeletal:  Negative for back pain, joint swelling and myalgias.  Skin:  Negative for rash.  Neurological:  Negative for dizziness, syncope, light-headedness, numbness and headaches.  Hematological:  Negative for adenopathy.  Psychiatric/Behavioral:  Negative for agitation, confusion, sleep disturbance and suicidal ideas. The patient is not nervous/anxious.      Objective: There were no vitals taken for this visit.   Physical Exam Constitutional:      Appearance: She is well-developed.  Genitourinary:     Vulva normal.     Genitourinary Comments: MILD TO MOD VAGINAL AND PERINEAL/PERIANAL ATROPHY OF TISSUE; FEW FISSURES VAGINALLY AND PERINEAL AREA     Right Labia: No rash, tenderness or lesions.    Left Labia: No tenderness, lesions or rash.    No vaginal discharge, erythema or tenderness.     Moderate vaginal atrophy present.     Right Adnexa: not tender and no mass present.    Left Adnexa: not tender and no mass present.    No cervical friability or polyp.     Uterus is not enlarged or tender.  Breasts:    Right: No mass, nipple discharge, skin change or tenderness.     Left: No mass, nipple discharge, skin change or tenderness.  Neck:     Thyroid: No thyromegaly.  Cardiovascular:     Rate and Rhythm: Normal rate and regular rhythm.     Heart sounds: Normal heart sounds. No murmur heard. Pulmonary:     Effort: Pulmonary effort is normal.     Breath sounds: Normal breath sounds.  Abdominal:     Palpations: Abdomen is soft.     Tenderness: There is no abdominal tenderness. There is no guarding or rebound.  Musculoskeletal:        General: Normal range of motion.     Cervical back: Normal range of motion.  Lymphadenopathy:     Cervical: No cervical adenopathy.  Neurological:     General: No focal deficit present.     Mental Status: She is alert and oriented to person, place, and time.     Cranial Nerves: No cranial nerve deficit.  Skin:     General: Skin is warm and dry.  Psychiatric:        Mood and Affect: Mood normal.        Behavior: Behavior normal.        Thought Content: Thought content normal.        Judgment: Judgment normal.  Vitals reviewed.     Assessment/Plan: Encounter for annual routine gynecological examination  Encounter for screening mammogram for malignant neoplasm of breast - Plan: MM 3D SCREEN BREAST BILATERAL; pt current on mammo, can sched 5/22  Perianal itch--sx improved with mycolog crm. Will call for RF prn. No evid LS at this time. Will cont to follow next yr. Keep area dry.  Screening for osteoporosis - Plan: [facility] Bone Density; DEXA order placed, pt to call [place] to sched. Cont ca/vit D.   Vasomotor symptoms--most likely not due to menopause since no sx for many yrs. Question stress related. Has upcoming labs with PCP. Wouldn't do HRT at her age and hx of TIA.       GYN counsel breast self exam, mammography screening, adequate intake of calcium and vitamin D, diet and exercise    F/U  No follow-ups on file.  Joran Kallal B. Annaliyah Willig, PA-C 12/28/2022 8:11 AM

## 2022-12-29 ENCOUNTER — Ambulatory Visit: Payer: Medicare PPO | Admitting: Obstetrics and Gynecology

## 2023-02-01 NOTE — Progress Notes (Unsigned)
PCP: Kirk Ruths, MD   No chief complaint on file.   HPI:      [name] is a 67 y.o. [phone] who LMP was No LMP recorded. Patient is postmenopausal., presents today for her annual examination.  Her menses are absent due to menopause.  She does not have PMB. She had occas night sweats but they have increased recently. Pt under increased stress training for marathon 11/22. Has hx of elevated LFTs, followed by PCP; no recent thyroid check.  Sex activity: not sexually active. She does not have vaginal dryness.  Last Pap: 12/15/18 Results were: no abnormalities /neg HPV DNA.  Hx of STDs: none  Last mammogram: 09/16/22  Results were: normal--routine follow-up in 12 months.  There is a FH of breast cancer in her mother, genetic testing not indicated. There is no FH of ovarian cancer. The patient does do self-breast exams.  Colonoscopy: colonoscopy 12/02/2018 without abnormalities.  Repeat due after 5 years due to previous hx of polyps.   DEXA: 9/22 at [facility], Results ***  Tobacco use: The patient denies current or previous tobacco use. Alcohol use: social drinker  No drug use Exercise: very active--runs 1/2 and full marathons  She does get adequate calcium and Vitamin D in her diet.  She has hx of perirectal and vaginal dryness/itching; treated with lotrisone sparingly in past (too much cream caused bleeding). She is a runner and often in damp underwear.  Discussed A&D/desitin oint in past to keep area dry. Lots of irritation 8/22 and given mycolog crm by MMF; using daily with sx relief. Concern for LS when last eval 3/21.   Labs with PCP  Past Medical History:  Diagnosis Date   Cancer ([place])    skin cancer   TIA (transient ischemic attack) 01/2020    Past Surgical History:  Procedure Laterality Date   COLONOSCOPY  12/02/2018   pt states results were fine   SKIN BIOPSY      Family History  Problem Relation Age of Onset   Breast cancer Mother 28   Skin  cancer Mother    Arthritis Mother    Asthma Mother    Hypertension Mother    Skin cancer Father    Arthritis Father    Prostate cancer Father    Throat cancer Maternal Grandfather    Arthritis Maternal Grandfather    Hypertension Maternal Grandfather    Arthritis Maternal Grandmother     Social History   Socioeconomic History   Marital status: Divorced    Spouse name: Not on file   Number of children: Not on file   Years of education: Not on file   Highest education level: Not on file  Occupational History   Not on file  Tobacco Use   Smoking status: Never   Smokeless tobacco: Never  Vaping Use   Vaping Use: Never used  Substance and Sexual Activity   Alcohol use: Yes   Drug use: No   Sexual activity: Not Currently    Birth control/protection: Post-menopausal  Other Topics Concern   Not on file  Social History Narrative   Not on file   Social Determinants of Health   Financial Resource Strain: Not on file  Food Insecurity: Not on file  Transportation Needs: Not on file  Physical Activity: Not on file  Stress: Not on file  Social Connections: Not on file  Intimate Partner Violence: Not on file    No outpatient medications have been marked as  taking for the 02/02/23 encounter (Appointment) with Allie Ousley, Deirdre Evener, PA-C.      ROS:  Review of Systems  Constitutional:  Negative for fatigue, fever and unexpected weight change.  Respiratory:  Negative for cough, shortness of breath and wheezing.   Cardiovascular:  Negative for chest pain, palpitations and leg swelling.  Gastrointestinal:  Negative for blood in stool, constipation, diarrhea, nausea and vomiting.  Endocrine: Negative for cold intolerance, heat intolerance and polyuria.  Genitourinary:  Negative for dyspareunia, dysuria, flank pain, frequency, genital sores, hematuria, menstrual problem, pelvic pain, urgency, vaginal bleeding, vaginal discharge and vaginal pain.  Musculoskeletal:  Negative for back  pain, joint swelling and myalgias.  Skin:  Negative for rash.  Neurological:  Negative for dizziness, syncope, light-headedness, numbness and headaches.  Hematological:  Negative for adenopathy.  Psychiatric/Behavioral:  Negative for agitation, confusion, sleep disturbance and suicidal ideas. The patient is not nervous/anxious.      Objective: There were no vitals taken for this visit.   Physical Exam Constitutional:      Appearance: She is well-developed.  Genitourinary:     Vulva normal.     Genitourinary Comments: MILD TO MOD VAGINAL AND PERINEAL/PERIANAL ATROPHY OF TISSUE; FEW FISSURES VAGINALLY AND PERINEAL AREA     Right Labia: No rash, tenderness or lesions.    Left Labia: No tenderness, lesions or rash.    No vaginal discharge, erythema or tenderness.     Moderate vaginal atrophy present.     Right Adnexa: not tender and no mass present.    Left Adnexa: not tender and no mass present.    No cervical friability or polyp.     Uterus is not enlarged or tender.  Breasts:    Right: No mass, nipple discharge, skin change or tenderness.     Left: No mass, nipple discharge, skin change or tenderness.  Neck:     Thyroid: No thyromegaly.  Cardiovascular:     Rate and Rhythm: Normal rate and regular rhythm.     Heart sounds: Normal heart sounds. No murmur heard. Pulmonary:     Effort: Pulmonary effort is normal.     Breath sounds: Normal breath sounds.  Abdominal:     Palpations: Abdomen is soft.     Tenderness: There is no abdominal tenderness. There is no guarding or rebound.  Musculoskeletal:        General: Normal range of motion.     Cervical back: Normal range of motion.  Lymphadenopathy:     Cervical: No cervical adenopathy.  Neurological:     General: No focal deficit present.     Mental Status: She is alert and oriented to person, place, and time.     Cranial Nerves: No cranial nerve deficit.  Skin:    General: Skin is warm and dry.  Psychiatric:        Mood  and Affect: Mood normal.        Behavior: Behavior normal.        Thought Content: Thought content normal.        Judgment: Judgment normal.  Vitals reviewed.     Assessment/Plan: Encounter for annual routine gynecological examination  Encounter for screening mammogram for malignant neoplasm of breast - Plan: MM 3D SCREEN BREAST BILATERAL; pt current on mammo, can sched 5/22  Perianal itch--sx improved with mycolog crm. Will call for RF prn. No evid LS at this time. Will cont to follow next yr. Keep area dry.   Screening for osteoporosis - Plan: DG  Bone Density; DEXA order placed, pt to call [place] to sched. Cont ca/vit D.   Vasomotor symptoms--most likely not due to menopause since no sx for many yrs. Question stress related. Has upcoming labs with PCP. Wouldn't do HRT at her age and hx of TIA.       GYN counsel breast self exam, mammography screening, adequate intake of calcium and vitamin D, diet and exercise    F/U  No follow-ups on file.  Rosaleen Mazer B. Samvel Zinn, PA-C 02/01/2023 9:46 PM

## 2023-02-02 ENCOUNTER — Ambulatory Visit (INDEPENDENT_AMBULATORY_CARE_PROVIDER_SITE_OTHER): Payer: Medicare PPO | Admitting: Obstetrics and Gynecology

## 2023-02-02 ENCOUNTER — Encounter: Payer: Self-pay | Admitting: Obstetrics and Gynecology

## 2023-02-02 ENCOUNTER — Other Ambulatory Visit (HOSPITAL_COMMUNITY)
Admission: RE | Admit: 2023-02-02 | Discharge: 2023-02-02 | Disposition: A | Discharge status: Home / Self Care | Payer: Medicare PPO | Source: Ambulatory Visit | Attending: Obstetrics and Gynecology | Admitting: Obstetrics and Gynecology

## 2023-02-02 VITALS — BP 108/70 | Ht 67.0 in | Wt 147.0 lb

## 2023-02-02 DIAGNOSIS — Z124 Encounter for screening for malignant neoplasm of cervix: Secondary | ICD-10-CM | POA: Insufficient documentation

## 2023-02-02 DIAGNOSIS — Z01419 Encounter for gynecological examination (general) (routine) without abnormal findings: Secondary | ICD-10-CM

## 2023-02-02 DIAGNOSIS — L900 Lichen sclerosus et atrophicus: Secondary | ICD-10-CM | POA: Diagnosis not present

## 2023-02-02 DIAGNOSIS — L290 Pruritus ani: Secondary | ICD-10-CM

## 2023-02-02 DIAGNOSIS — Z1231 Encounter for screening mammogram for malignant neoplasm of breast: Secondary | ICD-10-CM

## 2023-02-02 NOTE — Patient Instructions (Addendum)
I value your feedback and you entrusting us with your care. If you get a [facility] patient survey, I would appreciate you taking the time to let us know about your experience today. Thank you! ? ? ?

## 2023-02-04 LAB — SURGICAL PATHOLOGY

## 2023-02-04 MED ORDER — CLOBETASOL PROPIONATE 0.05 % EX OINT: TOPICAL_OINTMENT | CUTANEOUS | 0 refills | Status: DC

## 2023-02-04 NOTE — Addendum Note (Signed)
Addended by: Ardeth Perfect B on: [id number] 01:21 PM   Modules accepted: Orders

## 2023-02-05 LAB — CYTOLOGY - PAP: Diagnosis: NEGATIVE

## 2023-02-24 ENCOUNTER — Telehealth: Payer: Self-pay | Admitting: Obstetrics and Gynecology

## 2023-02-24 NOTE — Telephone Encounter (Signed)
Can place in same day spot in April. Don't schedule sooner than 4 wks from 3/5. Thx

## 2023-02-24 NOTE — Telephone Encounter (Signed)
I contacted the patient via phone. She is scheduled for 4/4 with [facility] at 9:35.

## 2023-02-24 NOTE — Telephone Encounter (Addendum)
The patient is calling requesting a medication follow up appointment. The patient didn't stop to make future appointment or she wanted to call back to make appointment. She was advise to follow up in 4 week for her visit on 3/5 with [facility]. Please advise scheduling.(2/8-2/12.Same day openings are all that are available at this time.

## 2023-03-02 NOTE — Progress Notes (Unsigned)
Heather Ruths, Heather Chase   No chief complaint on file.   HPI:      Heather Chase is a 67 y.o. [phone] whose LMP was No LMP recorded. Patient is postmenopausal., presents today for *** LS on 02/02/23 bx; started on clobetasol She has hx of perirectal and vaginal dryness/itching; treated with lotrisone sparingly in past (too much cream caused bleeding). She is a runner and often in damp underwear.  Uses non-cotton underwear but has cotton crotch. Discussed A&D/desitin oint in past to keep area dry and pt thinks helps sx somewhat. Lots of irritation 8/22 and given mycolog crm by MMF; uses frequently with temporary sx relief. No increased vag d/c. Concern for LS when last eval 3/21, not seen on 9/22 exam.   Patient Active Problem List   Diagnosis Date Noted   Perianal itch 10/11/2017   Vaginal atrophy 10/11/2017    Past Surgical History:  Procedure Laterality Date   COLONOSCOPY  12/02/2018   pt states results were fine   SKIN BIOPSY      Family History  Problem Relation Age of Onset   Stroke Mother    Breast cancer Mother 61   Skin cancer Mother    Arthritis Mother    Asthma Mother    Hypertension Mother    Skin cancer Father    Arthritis Father    Prostate cancer Father    Arthritis Maternal Grandmother    Throat cancer Maternal Grandfather    Arthritis Maternal Grandfather    Hypertension Maternal Grandfather     Social History   Socioeconomic History   Marital status: Divorced    Spouse name: Not on file   Number of children: Not on file   Years of education: Not on file   Highest education level: Not on file  Occupational History   Not on file  Tobacco Use   Smoking status: Never   Smokeless tobacco: Never  Vaping Use   Vaping Use: Never used  Substance and Sexual Activity   Alcohol use: Yes   Drug use: No   Sexual activity: Not Currently    Birth control/protection: Post-menopausal  Other Topics Concern   Not on file  Social History  Narrative   Not on file   Social Determinants of Health   Financial Resource Strain: Not on file  Food Insecurity: Not on file  Transportation Needs: Not on file  Physical Activity: Not on file  Stress: Not on file  Social Connections: Not on file  Intimate Partner Violence: Not on file    Outpatient Medications Prior to Visit  Medication Sig Dispense Refill   albuterol (VENTOLIN HFA) 108 (90 Base) MCG/ACT inhaler Inhale into the lungs.     aspirin 81 MG EC tablet Take by mouth.     CETIRIZINE HCL PO Take by mouth.     Cholecalciferol (VITAMIN D3) 50 MCG (2000 UT) capsule Take by mouth.     clobetasol ointment (TEMOVATE) 0.05 % Apply pea size amount to affected area every night for 4 weeks, then every other day for 4 weeks and then twice a week for 4 weeks or until resolution. 30 g 0   fluticasone (FLONASE) 50 MCG/ACT nasal spray Place into the nose.     nystatin-triamcinolone (MYCOLOG II) cream Apply 1 application topically 2 (two) times daily. 30 g 1   rosuvastatin (CRESTOR) 10 MG tablet Take 10 mg by mouth daily.     No facility-administered medications prior to visit.  ROS:  Review of Systems BREAST: No symptoms   OBJECTIVE:   Vitals:  There were no vitals taken for this visit.  Physical Exam  Results: No results found for this or any previous visit (from the past 24 hour(s)).   Assessment/Plan: No diagnosis found.    No orders of the defined types were placed in this encounter.     No follow-ups on file.  Waymon Laser B. Akera Snowberger, PA-C 03/02/2023 4:49 PM

## 2023-03-04 ENCOUNTER — Ambulatory Visit: Payer: Medicare PPO | Admitting: Obstetrics and Gynecology

## 2023-03-04 ENCOUNTER — Encounter: Payer: Self-pay | Admitting: Obstetrics and Gynecology

## 2023-03-04 VITALS — BP 100/60 | Ht 67.0 in | Wt 143.0 lb

## 2023-03-04 DIAGNOSIS — L900 Lichen sclerosus et atrophicus: Secondary | ICD-10-CM

## 2023-03-04 MED ORDER — CLOBETASOL PROPIONATE 0.05 % EX OINT: TOPICAL_OINTMENT | CUTANEOUS | 0 refills | Status: DC

## 2023-03-04 NOTE — Patient Instructions (Signed)
I value your feedback and you entrusting us with your care. If you get a [facility] patient survey, I would appreciate you taking the time to let us know about your experience today. Thank you! ? ? ?

## 2023-10-12 ENCOUNTER — Ambulatory Visit
Admission: RE | Admit: 2023-10-12 | Discharge: 2023-10-12 | Disposition: A | Discharge status: Home / Self Care | Payer: Medicare PPO | Source: Ambulatory Visit | Attending: Obstetrics and Gynecology | Admitting: Obstetrics and Gynecology

## 2023-10-12 DIAGNOSIS — Z1231 Encounter for screening mammogram for malignant neoplasm of breast: Secondary | ICD-10-CM | POA: Insufficient documentation

## 2024-03-01 ENCOUNTER — Ambulatory Visit
Admission: RE | Admit: 2024-03-01 | Discharge: 2024-03-01 | Disposition: A | Discharge status: Home / Self Care | Source: Ambulatory Visit | Attending: Emergency Medicine | Admitting: Emergency Medicine

## 2024-03-01 ENCOUNTER — Other Ambulatory Visit: Payer: Self-pay

## 2024-03-01 VITALS — BP 124/76 | HR 65 | Temp 98.8°F | Resp 16

## 2024-03-01 DIAGNOSIS — J0100 Acute maxillary sinusitis, unspecified: Secondary | ICD-10-CM

## 2024-03-01 MED ORDER — AMOXICILLIN-POT CLAVULANATE 875-125 MG PO TABS: 1.0000 | ORAL_TABLET | Freq: Two times a day (BID) | ORAL | 0 refills | Status: DC

## 2024-03-01 NOTE — ED Triage Notes (Signed)
 Patient presents to [facility] for evaluation of 1 week of nasal congestion with left sided facial pain.  Has tried sudafed, zyrtec and sinex saline nasal spray without relief.  Had flu in January

## 2024-03-01 NOTE — Discharge Instructions (Signed)
 You are being treated for sinus infection, your symptoms are related to the seasonal weather change versus germs as symptoms have progressed for 7 days without signs of resolution will provide you with some bacterial coverage  Take Augmentin twice daily for 7 days  You can take Tylenol and/or Ibuprofen as needed for fever reduction and pain relief.   For cough: honey 1/2 to 1 teaspoon (you can dilute the honey in water or another fluid).  You can also use guaifenesin and dextromethorphan for cough. You can use a humidifier for chest congestion and cough.  If you don't have a humidifier, you can sit in the bathroom with the hot shower running.      For sore throat: try warm salt water gargles, cepacol lozenges, throat spray, warm tea or water with lemon/honey, popsicles or ice, or OTC cold relief medicine for throat discomfort.   For congestion: take a daily anti-histamine like Zyrtec, Claritin, and a oral decongestant, such as pseudoephedrine.  You can also use Flonase 1-2 sprays in each nostril daily.   It is important to stay hydrated: drink plenty of fluids (water, gatorade/powerade/pedialyte, juices, or teas) to keep your throat moisturized and help further relieve irritation/discomfort.

## 2024-03-01 NOTE — ED Provider Notes (Signed)
 Heather Chase    CSN: [id number] Arrival date & time: 03/01/24  1559      History   Chief Complaint Chief Complaint  Patient presents with   Nasal Congestion    Sinus congestion for 1 week centered on left side of the face.  Brownish congestion. - Entered by patient   Facial Pain    HPI Heather Chase is a 68 y.o. female.   Patient presents for evaluation of nasal congestion, rhinorrhea and left-sided facial pain starting at the lips extending to the eye present for 7 days.  Experiencing a intermittent sharp shooting pain to the left ear.  No known sick contacts.  Denies fever.  Has attempted use of Sudafed PE, Zyrtec and Synex.  History of seasonal allergies.  Denies fever.  Past Medical History:  Diagnosis Date   Cancer [facility])    skin cancer   TIA (transient ischemic attack) 01/2020    Patient Active Problem List   Diagnosis Date Noted   Lichen sclerosus 03/04/2023   Perianal itch 10/11/2017   Vaginal atrophy 10/11/2017    Past Surgical History:  Procedure Laterality Date   COLONOSCOPY  12/02/2018   pt states results were fine   SKIN BIOPSY      OB History     Gravida  4   Para  3   Term  3   Preterm      AB  1   Living  3      SAB  1   IAB      Ectopic      Multiple      Live Births               Home Medications    Prior to Admission medications   Medication Sig Start Date End Date Taking? Authorizing Provider  amoxicillin-clavulanate (AUGMENTIN) 875-125 MG tablet Take 1 tablet by mouth every 12 (twelve) hours. 03/01/24  Yes Sarit Sparano R, NP  albuterol (VENTOLIN HFA) 108 (90 Base) MCG/ACT inhaler Inhale into the lungs. 07/16/21 07/16/22  [provider]  aspirin 81 MG EC tablet Take by mouth.    [provider]  CETIRIZINE HCL PO Take by mouth.    [provider]  Cholecalciferol (VITAMIN D3) 50 MCG (2000 UT) capsule Take by mouth.    [provider]  clobetasol ointment (TEMOVATE)  0.05 % Apply pea size amount to affected area 1-2 times weekly as maintenance 03/04/23   Copland, Alicia B, PA-C  fluticasone (FLONASE) 50 MCG/ACT nasal spray Place into the nose.    [provider]  rosuvastatin (CRESTOR) 10 MG tablet Take 10 mg by mouth daily. 05/20/21   [provider]    Family History Family History  Problem Relation Age of Onset   Stroke Mother    Breast cancer Mother 31   Skin cancer Mother    Arthritis Mother    Asthma Mother    Hypertension Mother    Skin cancer Father    Arthritis Father    Prostate cancer Father    Arthritis Maternal Grandmother    Throat cancer Maternal Grandfather    Arthritis Maternal Grandfather    Hypertension Maternal Grandfather     Social History Social History   Tobacco Use   Smoking status: Never   Smokeless tobacco: Never  Vaping Use   Vaping status: Never Used  Substance Use Topics   Alcohol use: Yes   Drug use: No     Allergies  Patient has no known allergies.   Review of Systems Review of Systems   Physical Exam Triage Vital Signs ED Triage Vitals  Encounter Vitals Group     BP 03/01/24 1607 124/76     Systolic BP Percentile --      Diastolic BP Percentile --      Pulse Rate 03/01/24 1607 65     Resp 03/01/24 1607 16     Temp 03/01/24 1607 98.8 F (37.1 C)     Temp Source 03/01/24 1607 Oral     SpO2 03/01/24 1607 96 %     Weight --      Height --      Head Circumference --      Peak Flow --      Pain Score 03/01/24 1608 5     Pain Loc --      Pain Education --      Exclude from Growth Chart --    No data found.  Updated Vital Signs BP 124/76 (BP Location: Left Arm)   Pulse 65   Temp 98.8 F (37.1 C) (Oral)   Resp 16   SpO2 96%   Visual Acuity Right Eye Distance:   Left Eye Distance:   Bilateral Distance:    Right Eye Near:   Left Eye Near:    Bilateral Near:     Physical Exam Constitutional:      Appearance: Normal appearance.  HENT:     Head:  Normocephalic.     Right Ear: Tympanic membrane, ear canal and external ear normal.     Left Ear: Tympanic membrane, ear canal and external ear normal.     Nose: Congestion present.     Left Sinus: Maxillary sinus tenderness present.     Mouth/Throat:     Mouth: Mucous membranes are moist.     Pharynx: Oropharynx is clear. No oropharyngeal exudate or posterior oropharyngeal erythema.  Eyes:     Extraocular Movements: Extraocular movements intact.  Cardiovascular:     Rate and Rhythm: Normal rate and regular rhythm.     Pulses: Normal pulses.     Heart sounds: Normal heart sounds.  Pulmonary:     Effort: Pulmonary effort is normal.     Breath sounds: Normal breath sounds.  Neurological:     Mental Status: She is alert and oriented to person, place, and time. Mental status is at baseline.      UC Treatments / Results  Labs (all labs ordered are listed, but only abnormal results are displayed) Labs Reviewed - No data to display  EKG   Radiology No results found.  Procedures Procedures (including critical care time)  Medications Ordered in UC Medications - No data to display  Initial Impression / Assessment and Plan / UC Course  I have reviewed the triage vital signs and the nursing notes.  Pertinent labs & imaging results that were available during my care of the patient were reviewed by me and considered in my medical decision making (see chart for details).  Acute nonrecurrent maxillary sinusitis  Patient is in no signs of distress nor toxic appearing.  Vital signs are stable.  Low suspicion for pneumonia, pneumothorax or bronchitis and therefore will defer imaging.  Viral testing deferred due to timeline of illness.  Presentation consistent with a sinusitis, etiology viral/bacterial versus seasonal allergies, discussed with patient.  Symptoms present for 7 days will provide prophylactic coverage, prescribed Augmentin, declined prednisone.May use additional  over-the-counter medications as needed for supportive  care.  May follow-up with urgent care as needed if symptoms persist or worsen.   Final Clinical Impressions(s) / UC Diagnoses   Final diagnoses:  Acute non-recurrent maxillary sinusitis     Discharge Instructions      You are being treated for sinus infection, your symptoms are related to the seasonal weather change versus germs as symptoms have progressed for 7 days without signs of resolution will provide you with some bacterial coverage  Take Augmentin twice daily for 7 days  You can take Tylenol and/or Ibuprofen as needed for fever reduction and pain relief.   For cough: honey 1/2 to 1 teaspoon (you can dilute the honey in water or another fluid).  You can also use guaifenesin and dextromethorphan for cough. You can use a humidifier for chest congestion and cough.  If you don't have a humidifier, you can sit in the bathroom with the hot shower running.      For sore throat: try warm salt water gargles, cepacol lozenges, throat spray, warm tea or water with lemon/honey, popsicles or ice, or OTC cold relief medicine for throat discomfort.   For congestion: take a daily anti-histamine like Zyrtec, Claritin, and a oral decongestant, such as pseudoephedrine.  You can also use Flonase 1-2 sprays in each nostril daily.   It is important to stay hydrated: drink plenty of fluids (water, gatorade/powerade/pedialyte, juices, or teas) to keep your throat moisturized and help further relieve irritation/discomfort.    ED Prescriptions     Medication Sig Dispense Auth. Provider   amoxicillin-clavulanate (AUGMENTIN) 875-125 MG tablet Take 1 tablet by mouth every 12 (twelve) hours. 14 tablet Mekenzie Modeste, Elita Boone, NP      PDMP not reviewed this encounter.   Valinda Hoar, [place] 03/01/24 [phone]

## 2024-04-07 ENCOUNTER — Ambulatory Visit
Admission: EM | Admit: 2024-04-07 | Discharge: 2024-04-07 | Disposition: A | Discharge status: Home / Self Care | Attending: Emergency Medicine | Admitting: Emergency Medicine

## 2024-04-07 DIAGNOSIS — L237 Allergic contact dermatitis due to plants, except food: Secondary | ICD-10-CM | POA: Diagnosis not present

## 2024-04-07 MED ORDER — PREDNISONE 10 MG (21) PO TBPK: ORAL_TABLET | Freq: Every day | ORAL | 0 refills | Status: DC

## 2024-04-07 NOTE — ED Triage Notes (Signed)
 Patient presents to UC for rash x 2 weeks. States she went hiking and had on shorts. Has been taking benadryl at night and applying ointment with no relief.

## 2024-04-07 NOTE — ED Provider Notes (Signed)
 Arlander Bellman    CSN: [id number] Arrival date & time: 04/07/24  1724      History   Chief Complaint Chief Complaint  Patient presents with   Rash    HPI Heather Chase is a 68 y.o. female.   Patient presents for evaluation of erythematous pruritic rash present to the bilateral lower extremities beginning 2 weeks ago after hiking near the hallways and appears shortness.  Has attempted use of Benadryl and CeraVe anti-itch cream which has been helpful but symptoms continue to persist as she finds new spots daily.  Denies drainage or fever.  Denies changes in toiletries, diet or recent travel.  No other close contact has similar symptoms.  Past Medical History:  Diagnosis Date   Cancer [facility])    skin cancer   TIA (transient ischemic attack) 01/2020    Patient Active Problem List   Diagnosis Date Noted   Lichen sclerosus 03/04/2023   Perianal itch 10/11/2017   Vaginal atrophy 10/11/2017    Past Surgical History:  Procedure Laterality Date   COLONOSCOPY  12/02/2018   pt states results were fine   SKIN BIOPSY      OB History     Gravida  4   Para  3   Term  3   Preterm      AB  1   Living  3      SAB  1   IAB      Ectopic      Multiple      Live Births               Home Medications    Prior to Admission medications   Medication Sig Start Date End Date Taking? Authorizing Provider  predniSONE (STERAPRED UNI-PAK 21 TAB) 10 MG (21) TBPK tablet Take by mouth daily. Take 6 tabs by mouth daily  for 1 days, then 5 tabs for 1 days, then 4 tabs for 1 days, then 3 tabs for 1 days, 2 tabs for 1 days, then 1 tab by mouth daily for 1 days 04/07/24  Yes Marji Kuehnel R, NP  albuterol (VENTOLIN HFA) 108 (90 Base) MCG/ACT inhaler Inhale into the lungs. 07/16/21 07/16/22  [provider]  amoxicillin -clavulanate (AUGMENTIN ) 875-125 MG tablet Take 1 tablet by mouth every 12 (twelve) hours. 03/01/24   Reena Canning, NP  aspirin 81 MG EC  tablet Take by mouth.    [provider]  CETIRIZINE HCL PO Take by mouth.    [provider]  Cholecalciferol (VITAMIN D3) 50 MCG (2000 UT) capsule Take by mouth.    [provider]  clobetasol  ointment (TEMOVATE ) 0.05 % Apply pea size amount to affected area 1-2 times weekly as maintenance 03/04/23   Copland, Alicia B, PA-C  fluticasone (FLONASE) 50 MCG/ACT nasal spray Place into the nose.    [provider]  rosuvastatin (CRESTOR) 10 MG tablet Take 10 mg by mouth daily. 05/20/21   [provider]    Family History Family History  Problem Relation Age of Onset   Stroke Mother    Breast cancer Mother 88   Skin cancer Mother    Arthritis Mother    Asthma Mother    Hypertension Mother    Skin cancer Father    Arthritis Father    Prostate cancer Father    Arthritis Maternal Grandmother    Throat cancer Maternal Grandfather    Arthritis Maternal Grandfather    Hypertension Maternal Grandfather  Social History Social History   Tobacco Use   Smoking status: Never   Smokeless tobacco: Never  Vaping Use   Vaping status: Never Used  Substance Use Topics   Alcohol use: Yes   Drug use: No     Allergies   Patient has no known allergies.   Review of Systems Review of Systems  Skin:  Positive for rash.     Physical Exam Triage Vital Signs ED Triage Vitals  Encounter Vitals Group     BP 04/07/24 1731 128/69     Systolic BP Percentile --      Diastolic BP Percentile --      Pulse Rate 04/07/24 1731 63     Resp 04/07/24 1731 16     Temp 04/07/24 1731 (!) 96.6 F (35.9 C)     Temp Source 04/07/24 1731 Temporal     SpO2 04/07/24 1731 98 %     Weight --      Height --      Head Circumference --      Peak Flow --      Pain Score 04/07/24 1733 0     Pain Loc --      Pain Education --      Exclude from Growth Chart --    No data found.  Updated Vital Signs BP 128/69 (BP Location: Left Arm)   Pulse 63   Temp (!) 96.6  F (35.9 C) (Temporal)   Resp 16   SpO2 98%   Visual Acuity Right Eye Distance:   Left Eye Distance:   Bilateral Distance:    Right Eye Near:   Left Eye Near:    Bilateral Near:     Physical Exam Constitutional:      Appearance: Normal appearance.  HENT:     Head: Normocephalic.  Eyes:     Extraocular Movements: Extraocular movements intact.  Pulmonary:     Effort: Pulmonary effort is normal.  Skin:    Comments: Erythematous blistering rash present to the bilateral lower extremities  Neurological:     Mental Status: She is alert and oriented to person, place, and time. Mental status is at baseline.      UC Treatments / Results  Labs (all labs ordered are listed, but only abnormal results are displayed) Labs Reviewed - No data to display  EKG   Radiology No results found.  Procedures Procedures (including critical care time)  Medications Ordered in UC Medications - No data to display  Initial Impression / Assessment and Plan / UC Course  I have reviewed the triage vital signs and the nursing notes.  Pertinent labs & imaging results that were available during my care of the patient were reviewed by me and considered in my medical decision making (see chart for details).  poison ivy dermatitis  Appears inflammatory, no signs of infection, prescribed prednisone, may continue use of oral antihistamines and additional supportive care for management of pruritus, advised loss of disposable heat to prevent further irritation advised follow-up if symptoms continue to persist or worsen Final Clinical Impressions(s) / UC Diagnoses   Final diagnoses:  Poison ivy dermatitis   Discharge Instructions      Today you are being treated for the poison ivy/oak rash   take prednisone every morning with food as directed, to continue the above process to help reduce the inflammatory process that occurs with this rash which will help minimize your itching as well as begin to  clear  You may  use of topical calamine or Benadryl cream to help manage itching, you may also continue oral Benadryl  Please avoid long exposures to heat such as a hot steamy shower or being outside as this may cause further irritation to your rash  You may follow-up with his urgent care as needed if symptoms persist or worsen   ED Prescriptions     Medication Sig Dispense Auth. Provider   predniSONE (STERAPRED UNI-PAK 21 TAB) 10 MG (21) TBPK tablet Take by mouth daily. Take 6 tabs by mouth daily  for 1 days, then 5 tabs for 1 days, then 4 tabs for 1 days, then 3 tabs for 1 days, 2 tabs for 1 days, then 1 tab by mouth daily for 1 days 21 tablet Brizeida Mcmurry, Maybelle Spatz, NP      PDMP not reviewed this encounter.   Reena Canning, NP 04/07/24 [phone]

## 2024-04-07 NOTE — Discharge Instructions (Signed)
 Today you are being treated for the poison ivy/oak rash   take prednisone every morning with food as directed, to continue the above process to help reduce the inflammatory process that occurs with this rash which will help minimize your itching as well as begin to clear  You may use of topical calamine or Benadryl cream to help manage itching, you may also continue oral Benadryl  Please avoid long exposures to heat such as a hot steamy shower or being outside as this may cause further irritation to your rash  You may follow-up with his urgent care as needed if symptoms persist or worsen

## 2024-08-03 ENCOUNTER — Ambulatory Visit
Admission: EM | Admit: 2024-08-03 | Discharge: 2024-08-03 | Disposition: A | Discharge status: Home / Self Care | Attending: Emergency Medicine | Admitting: Emergency Medicine

## 2024-08-03 ENCOUNTER — Encounter: Payer: Self-pay | Admitting: Emergency Medicine

## 2024-08-03 DIAGNOSIS — J0110 Acute frontal sinusitis, unspecified: Secondary | ICD-10-CM | POA: Diagnosis not present

## 2024-08-03 MED ORDER — AMOXICILLIN-POT CLAVULANATE 875-125 MG PO TABS: 1.0000 | ORAL_TABLET | Freq: Two times a day (BID) | ORAL | 0 refills | Status: DC

## 2024-08-03 NOTE — Discharge Instructions (Signed)
 Begin Augmentin  twice daily for 7 days for bacterial coverage as this is most likely causing symptoms to linger    You can take Tylenol and/or Ibuprofen as needed for fever reduction and pain relief.   For cough: honey 1/2 to 1 teaspoon (you can dilute the honey in water or another fluid).  You can also use guaifenesin and dextromethorphan for cough. You can use a humidifier for chest congestion and cough.  If you don't have a humidifier, you can sit in the bathroom with the hot shower running.      For sore throat: try warm salt water gargles, cepacol lozenges, throat spray, warm tea or water with lemon/honey, popsicles or ice, or OTC cold relief medicine for throat discomfort.   For congestion: take a daily anti-histamine like Zyrtec, Claritin, and a oral decongestant, such as pseudoephedrine.  You can also use Flonase 1-2 sprays in each nostril daily.   It is important to stay hydrated: drink plenty of fluids (water, gatorade/powerade/pedialyte, juices, or teas) to keep your throat moisturized and help further relieve irritation/discomfort.

## 2024-08-03 NOTE — ED Triage Notes (Signed)
 Patient reports nasal congestion and sinus pressure x 4 weeks.  Rates pressure 7/10. Patient reports that she took sudafed this morning and Nasacort for symptoms with no relief.

## 2024-08-03 NOTE — ED Provider Notes (Signed)
 [name]    CSN: [id number] Arrival date & time: 08/03/24  1208      History   Chief Complaint Chief Complaint  Patient presents with   Nasal Congestion   Facial Pain    HPI Heather Chase is a 68 y.o. female.   Patient presents for evaluation of nasal congestion, sinus pressure to the frontal aspect, currently present to the right but 2 weeks ago was experiencing on the left side, bilateral ear popping, intermittent headaches present for 1 month.  Also has noticed mild wheezing only after running, denies shortness of breath.  Has attempted use of right, taking Xyzal and Nasacort daily.  Tolerable to food and liquids.  No known sick contact denies fever.  Denies respiratory history, non-smoker.  Past Medical History:  Diagnosis Date   Cancer [facility])    skin cancer   TIA (transient ischemic attack) 01/2020    Patient Active Problem List   Diagnosis Date Noted   Lichen sclerosus 03/04/2023   Perianal itch 10/11/2017   Vaginal atrophy 10/11/2017    Past Surgical History:  Procedure Laterality Date   COLONOSCOPY  12/02/2018   pt states results were fine   SKIN BIOPSY      OB History     Gravida  4   Para  3   Term  3   Preterm      AB  1   Living  3      SAB  1   IAB      Ectopic      Multiple      Live Births               Home Medications    Prior to Admission medications   Medication Sig Start Date End Date Taking? Authorizing Provider  albuterol (VENTOLIN HFA) 108 (90 Base) MCG/ACT inhaler Inhale into the lungs. 07/16/21 07/16/22  [provider]  amoxicillin -clavulanate (AUGMENTIN ) 875-125 MG tablet Take 1 tablet by mouth every 12 (twelve) hours. 08/03/24   [name], NP  aspirin 81 MG EC tablet Take by mouth.    [provider]  CETIRIZINE HCL PO Take by mouth.    [provider]  Cholecalciferol (VITAMIN D3) 50 MCG (2000 UT) capsule Take by mouth.    [provider]  clobetasol   ointment (TEMOVATE ) 0.05 % Apply pea size amount to affected area 1-2 times weekly as maintenance 03/04/23   Copland, Alicia B, PA-C  fluticasone (FLONASE) 50 MCG/ACT nasal spray Place into the nose.    [provider]  predniSONE  (STERAPRED UNI-PAK 21 TAB) 10 MG (21) TBPK tablet Take by mouth daily. Take 6 tabs by mouth daily  for 1 days, then 5 tabs for 1 days, then 4 tabs for 1 days, then 3 tabs for 1 days, 2 tabs for 1 days, then 1 tab by mouth daily for 1 days 04/07/24   [name], NP  rosuvastatin (CRESTOR) 10 MG tablet Take 10 mg by mouth daily. 05/20/21   [provider]    Family History Family History  Problem Relation Age of Onset   Stroke Mother    Breast cancer Mother 59   Skin cancer Mother    Arthritis Mother    Asthma Mother    Hypertension Mother    Skin cancer Father    Arthritis Father    Prostate cancer Father    Arthritis Maternal Grandmother    Throat cancer Maternal Grandfather  Arthritis Maternal Grandfather    Hypertension Maternal Grandfather     Social History Social History   Tobacco Use   Smoking status: Never   Smokeless tobacco: Never  Vaping Use   Vaping status: Never Used  Substance Use Topics   Alcohol use: Yes   Drug use: No     Allergies   Patient has no known allergies.   Review of Systems Review of Systems   Physical Exam Triage Vital Signs ED Triage Vitals  Encounter Vitals Group     BP 08/03/24 1255 128/78     Girls Systolic BP Percentile --      Girls Diastolic BP Percentile --      Boys Systolic BP Percentile --      Boys Diastolic BP Percentile --      Pulse Rate 08/03/24 1255 (!) 56     Resp 08/03/24 1255 18     Temp 08/03/24 1255 97.9 F (36.6 C)     Temp Source 08/03/24 1255 Oral     SpO2 08/03/24 1255 98 %     Weight --      Height --      Head Circumference --      Peak Flow --      Pain Score 08/03/24 1259 7     Pain Loc --      Pain Education --      Exclude from Growth Chart  --    No data found.  Updated Vital Signs BP 128/78 (BP Location: Left Arm)   Pulse (!) 56   Temp 97.9 F (36.6 C) (Oral)   Resp 18   SpO2 98%   Visual Acuity Right Eye Distance:   Left Eye Distance:   Bilateral Distance:    Right Eye Near:   Left Eye Near:    Bilateral Near:     Physical Exam Constitutional:      Appearance: Normal appearance.  HENT:     Right Ear: Tympanic membrane, ear canal and external ear normal.     Left Ear: Tympanic membrane, ear canal and external ear normal.     Nose: Congestion present.     Right Sinus: Frontal sinus tenderness present. No maxillary sinus tenderness.     Left Sinus: Frontal sinus tenderness present. No maxillary sinus tenderness.     Mouth/Throat:     Pharynx: No oropharyngeal exudate or posterior oropharyngeal erythema.  Eyes:     Extraocular Movements: Extraocular movements intact.  Cardiovascular:     Rate and Rhythm: Normal rate and regular rhythm.     Pulses: Normal pulses.     Heart sounds: Normal heart sounds.  Pulmonary:     Effort: Pulmonary effort is normal.     Breath sounds: Normal breath sounds.  Musculoskeletal:     Cervical back: Normal range of motion and neck supple.  Neurological:     Mental Status: She is alert and oriented to person, place, and time. Mental status is at baseline.      UC Treatments / Results  Labs (all labs ordered are listed, but only abnormal results are displayed) Labs Reviewed - No data to display  EKG   Radiology No results found.  Procedures Procedures (including critical care time)  Medications Ordered in UC Medications - No data to display  Initial Impression / Assessment and Plan / UC Course  I have reviewed the triage vital signs and the nursing notes.  Pertinent labs & imaging results that were available during  my care of the patient were reviewed by me and considered in my medical decision making (see chart for details).  Acute nonrecurrent frontal  sinusitis  Vital signs are stable, patient is in no signs of distress nontoxic-appearing, lungs clear to auscultation and O2 saturation 98% on room air, stable for outpatient management, offered prednisone  for management of wheezing, declined, endorses availability of albuterol inhaler, advised to use as needed, prescribed Augmentin  as she has been symptomatic for 1 month and presentation is consistent with a sinus infection, recommended over-the-counter medications and nonpharmacological measures and advised follow-up if symptoms persist Final Clinical Impressions(s) / UC Diagnoses   Final diagnoses:  Acute non-recurrent frontal sinusitis     Discharge Instructions      Begin Augmentin  twice daily for 7 days for bacterial coverage as this is most likely causing symptoms to linger    You can take Tylenol and/or Ibuprofen as needed for fever reduction and pain relief.   For cough: honey 1/2 to 1 teaspoon (you can dilute the honey in water or another fluid).  You can also use guaifenesin and dextromethorphan for cough. You can use a humidifier for chest congestion and cough.  If you don't have a humidifier, you can sit in the bathroom with the hot shower running.      For sore throat: try warm salt water gargles, cepacol lozenges, throat spray, warm tea or water with lemon/honey, popsicles or ice, or OTC cold relief medicine for throat discomfort.   For congestion: take a daily anti-histamine like Zyrtec, Claritin, and a oral decongestant, such as pseudoephedrine.  You can also use Flonase 1-2 sprays in each nostril daily.   It is important to stay hydrated: drink plenty of fluids (water, gatorade/powerade/pedialyte, juices, or teas) to keep your throat moisturized and help further relieve irritation/discomfort.    ED Prescriptions     Medication Sig Dispense Auth. Provider   amoxicillin -clavulanate (AUGMENTIN ) 875-125 MG tablet Take 1 tablet by mouth every 12 (twelve) hours. 14 tablet  Mahum Betten R, NP      PDMP not reviewed this encounter.   Teresa Shelba SAUNDERS, NP 08/03/24 1324

## 2024-08-21 ENCOUNTER — Ambulatory Visit

## 2024-08-21 DIAGNOSIS — Z09 Encounter for follow-up examination after completed treatment for conditions other than malignant neoplasm: Secondary | ICD-10-CM | POA: Diagnosis not present

## 2024-08-21 DIAGNOSIS — K640 First degree hemorrhoids: Secondary | ICD-10-CM | POA: Diagnosis not present

## 2024-08-21 DIAGNOSIS — Z860101 Personal history of adenomatous and serrated colon polyps: Secondary | ICD-10-CM | POA: Diagnosis not present

## 2024-09-06 ENCOUNTER — Other Ambulatory Visit: Payer: Self-pay | Admitting: Internal Medicine

## 2024-09-06 DIAGNOSIS — Z1231 Encounter for screening mammogram for malignant neoplasm of breast: Secondary | ICD-10-CM

## 2024-10-05 ENCOUNTER — Ambulatory Visit: Admitting: Obstetrics and Gynecology

## 2024-10-10 ENCOUNTER — Ambulatory Visit: Admission: EM | Admit: 2024-10-10 | Discharge: 2024-10-10 | Disposition: A | Discharge status: Home / Self Care

## 2024-10-10 DIAGNOSIS — J329 Chronic sinusitis, unspecified: Secondary | ICD-10-CM | POA: Diagnosis not present

## 2024-10-10 MED ORDER — CEFDINIR 300 MG PO CAPS: 300.0000 mg | ORAL_CAPSULE | Freq: Two times a day (BID) | ORAL | 0 refills | Status: AC

## 2024-10-10 NOTE — Discharge Instructions (Addendum)
 Take the cefdinir as directed.  Follow-up with your primary care provider or an ENT as discussed.

## 2024-10-10 NOTE — ED Triage Notes (Signed)
 Patient to Urgent Care with complaints of  facial pain/ sinus pressure. Describes feeling like she has a knife in her left eye.   Symptoms started x10 days.   Using nasocort/ netti-pot/ zyrtec.

## 2024-10-10 NOTE — ED Provider Notes (Signed)
 Heather Chase    CSN: [id number] Arrival date & time: 10/10/24  1101      History   Chief Complaint Chief Complaint  Patient presents with   Facial Pain    HPI Heather Chase is a 68 y.o. female.  Patient presents with 10-day history of of frontal sinus pressure, sinus congestion, and sinus pain behind left eye.  Patient states this is how her sinus infections have presented in the past for many years.  She used a [employer] pot yesterday without relief.  She has been treating her symptoms with Zyrtec and Nasacort.  She denies fever, chills, cough, shortness of breath.  Patient was seen at this urgent care on 08/03/2024; diagnosed with acute sinusitis; treated with Augmentin .  She states her symptoms resolved with this treatment but then returned 10 days ago.  The history is provided by the patient and medical records.    Past Medical History:  Diagnosis Date   Cancer [facility])    skin cancer   TIA (transient ischemic attack) 01/2020    Patient Active Problem List   Diagnosis Date Noted   Lichen sclerosus 03/04/2023   Perianal itch 10/11/2017   Vaginal atrophy 10/11/2017    Past Surgical History:  Procedure Laterality Date   COLONOSCOPY  12/02/2018   pt states results were fine   SKIN BIOPSY      OB History     Gravida  4   Para  3   Term  3   Preterm      AB  1   Living  3      SAB  1   IAB      Ectopic      Multiple      Live Births               Home Medications    Prior to Admission medications   Medication Sig Start Date End Date Taking? Authorizing Provider  cefdinir (OMNICEF) 300 MG capsule Take 1 capsule (300 mg total) by mouth 2 (two) times daily for 7 days. 10/10/24 10/17/24 Yes Corlis Burnard DEL, NP  Multiple Vitamin (MULTIVITAMIN) tablet Take 1 tablet by mouth daily.   Yes [provider]  albuterol (VENTOLIN HFA) 108 (90 Base) MCG/ACT inhaler Inhale into the lungs. 07/16/21 07/16/22  [provider]  aspirin  81 MG EC tablet Take by mouth.    [provider]  CETIRIZINE HCL PO Take by mouth.    [provider]  Cholecalciferol (VITAMIN D3) 50 MCG (2000 UT) capsule Take by mouth.    [provider]  clobetasol  ointment (TEMOVATE ) 0.05 % Apply pea size amount to affected area 1-2 times weekly as maintenance 03/04/23   Copland, Alicia B, PA-C  fluticasone (FLONASE) 50 MCG/ACT nasal spray Place into the nose.    [provider]  predniSONE  (STERAPRED UNI-PAK 21 TAB) 10 MG (21) TBPK tablet Take by mouth daily. Take 6 tabs by mouth daily  for 1 days, then 5 tabs for 1 days, then 4 tabs for 1 days, then 3 tabs for 1 days, 2 tabs for 1 days, then 1 tab by mouth daily for 1 days Patient not taking: Reported on 10/10/2024 04/07/24   [name], NP  rosuvastatin (CRESTOR) 10 MG tablet Take 10 mg by mouth daily. 05/20/21   [provider]    Family History Family History  Problem Relation Age of Onset   Stroke Mother    Breast cancer  Mother 64   Skin cancer Mother    Arthritis Mother    Asthma Mother    Hypertension Mother    Skin cancer Father    Arthritis Father    Prostate cancer Father    Arthritis Maternal Grandmother    Throat cancer Maternal Grandfather    Arthritis Maternal Grandfather    Hypertension Maternal Grandfather     Social History Social History   Tobacco Use   Smoking status: Never   Smokeless tobacco: Never  Vaping Use   Vaping status: Never Used  Substance Use Topics   Alcohol use: Yes   Drug use: No     Allergies   Patient has no known allergies.   Review of Systems Review of Systems  Constitutional:  Negative for chills and fever.  HENT:  Positive for congestion, sinus pressure and sinus pain. Negative for ear pain, postnasal drip and sore throat.   Respiratory:  Negative for cough and shortness of breath.   Neurological:  Positive for headaches.     Physical Exam Triage Vital Signs ED Triage Vitals  [10/10/24 1215]  Encounter Vitals Group     BP      Girls Systolic BP Percentile      Girls Diastolic BP Percentile      Boys Systolic BP Percentile      Boys Diastolic BP Percentile      Pulse      Resp      Temp      Temp src      SpO2      Weight      Height      Head Circumference      Peak Flow      Pain Score 8     Pain Loc      Pain Education      Exclude from Growth Chart    No data found.  Updated Vital Signs BP 126/76   Pulse (!) 53   Temp 98.3 F (36.8 C)   Resp 18   SpO2 98%   Visual Acuity Right Eye Distance:   Left Eye Distance:   Bilateral Distance:    Right Eye Near:   Left Eye Near:    Bilateral Near:     Physical Exam Constitutional:      General: She is not in acute distress. HENT:     Right Ear: Tympanic membrane normal.     Left Ear: Tympanic membrane normal.     Nose: Nose normal.     Mouth/Throat:     Mouth: Mucous membranes are moist.     Pharynx: Oropharynx is clear.  Cardiovascular:     Rate and Rhythm: Normal rate and regular rhythm.     Heart sounds: Normal heart sounds.  Pulmonary:     Effort: Pulmonary effort is normal. No respiratory distress.     Breath sounds: Normal breath sounds.  Neurological:     Mental Status: She is alert.      UC Treatments / Results  Labs (all labs ordered are listed, but only abnormal results are displayed) Labs Reviewed - No data to display  EKG   Radiology No results found.  Procedures Procedures (including critical care time)  Medications Ordered in UC Medications - No data to display  Initial Impression / Assessment and Plan / UC Course  I have reviewed the triage vital signs and the nursing notes.  Pertinent labs & imaging results that were available during my care of  the patient were reviewed by me and considered in my medical decision making (see chart for details).    Recurrent sinusitis.  Afebrile and vital signs are stable.  Lungs are clear and O2 sat is 98% on  room air.  Treating today with 7-day course of cefdinir as patient states this is her usual presentation with sinus infection but discussed with patient that she will need to follow-up with her PCP or an ENT due to her recurrence of symptoms.  She states she has an appointment with her PCP in 2 weeks.  She will consider going to see an ENT.  She agrees to plan of care.  Final Clinical Impressions(s) / UC Diagnoses   Final diagnoses:  Recurrent sinusitis     Discharge Instructions      Take the cefdinir as directed.  Follow-up with your primary care provider or an ENT as discussed.     ED Prescriptions     Medication Sig Dispense Auth. Provider   cefdinir (OMNICEF) 300 MG capsule Take 1 capsule (300 mg total) by mouth 2 (two) times daily for 7 days. 14 capsule Corlis Burnard DEL, NP      PDMP not reviewed this encounter.   Corlis Burnard DEL, NP 10/10/24 1250

## 2024-10-13 ENCOUNTER — Ambulatory Visit
Admission: RE | Admit: 2024-10-13 | Discharge: 2024-10-13 | Disposition: A | Discharge status: Home / Self Care | Source: Ambulatory Visit | Attending: Internal Medicine | Admitting: Internal Medicine

## 2024-10-13 DIAGNOSIS — Z1231 Encounter for screening mammogram for malignant neoplasm of breast: Secondary | ICD-10-CM | POA: Insufficient documentation

## 2024-10-15 NOTE — Progress Notes (Unsigned)
 PCP: Lenon Layman ORN, MD   No chief complaint on file.   HPI:      [name] is a 68 y.o. [phone] who LMP was No LMP recorded. Patient is postmenopausal., presents today for her annual examination.  Her menses are absent due to menopause.  She does not have PMB. Decreased vasomotor sx this yr.   Sex activity: not sexually active. She does not have vaginal dryness.  Last Pap: 02/02/23 Results were: no abnormalities /neg HPV DNA 2020 Hx of STDs: none  Last mammogram: 10/13/24  Results were: normal--routine follow-up in 12 months. Pending??? There is a FH of breast cancer in her mother, genetic testing not indicated. There is no FH of ovarian cancer. The patient does do self-breast exams.  Colonoscopy: colonoscopy 12/02/2018 without abnormalities.  Repeat due after 5 years due to previous hx of polyps.   DEXA: 9/22 at [facility], osteopenia in hip, normal spine. Doing ca/Vit D.   Tobacco use: The patient denies current or previous tobacco use. Alcohol use: social drinker  No drug use Exercise: very active--runs 1/2 and full marathons  She does get adequate calcium and Vitamin D in her diet.  She has hx of perirectal and vaginal dryness/itching; treated with lotrisone  sparingly in past (too much cream caused bleeding). She is a runner and often in damp underwear.  Uses non-cotton underwear but has cotton crotch. Discussed A&D/desitin oint in past to keep area dry and pt thinks helps sx somewhat. Lots of irritation 8/22 and given mycolog crm by [facility]; uses frequently with temporary sx relief. No increased vag d/c. Concern for LS when last eval 3/21, not seen on 9/22 exam. Bx  3/24 showed LS, started clobetasol   Labs with PCP  Past Medical History:  Diagnosis Date   Cancer (HCC)    skin cancer   TIA (transient ischemic attack) 01/2020    Past Surgical History:  Procedure Laterality Date   COLONOSCOPY  12/02/2018   pt states results were fine   SKIN BIOPSY      Family  History  Problem Relation Age of Onset   Stroke Mother    Breast cancer Mother 5   Skin cancer Mother    Arthritis Mother    Asthma Mother    Hypertension Mother    Skin cancer Father    Arthritis Father    Prostate cancer Father    Arthritis Maternal Grandmother    Throat cancer Maternal Grandfather    Arthritis Maternal Grandfather    Hypertension Maternal Grandfather     Social History   Socioeconomic History   Marital status: Divorced    Spouse name: Not on file   Number of children: Not on file   Years of education: Not on file   Highest education level: Not on file  Occupational History   Not on file  Tobacco Use   Smoking status: Never   Smokeless tobacco: Never  Vaping Use   Vaping status: Never Used  Substance and Sexual Activity   Alcohol use: Yes   Drug use: No   Sexual activity: Not Currently    Birth control/protection: Post-menopausal  Other Topics Concern   Not on file  Social History Narrative   Not on file   Social Drivers of Health   Financial Resource Strain: Low Risk  (07/05/2024)   Received from [facility]   Overall Financial Resource Strain (CARDIA)    Difficulty of Paying Living Expenses: Not hard at all  Food Insecurity: No Food Insecurity (07/05/2024)   Received from [facility]   Hunger Vital Sign    Within the past 12 months, you worried that your food would run out before you got the money to buy more.: Never true    Within the past 12 months, the food you bought just didn't last and you didn't have money to get more.: Never true  Transportation Needs: No Transportation Needs (07/05/2024)   Received from [facility] - Transportation    In the past 12 months, has lack of transportation kept you from medical appointments or from getting medications?: No    Lack of Transportation (Non-Medical): No  Physical Activity: Not on file  Stress: Not on file  Social Connections: Not  on file  Intimate Partner Violence: Not on file    No outpatient medications have been marked as taking for the 10/16/24 encounter (Appointment) with Fredrica Capano B, PA-C.      ROS:  Review of Systems  Constitutional:  Negative for fatigue, fever and unexpected weight change.  Respiratory:  Negative for cough, shortness of breath and wheezing.   Cardiovascular:  Negative for chest pain, palpitations and leg swelling.  Gastrointestinal:  Negative for blood in stool, constipation, diarrhea, nausea and vomiting.  Endocrine: Negative for cold intolerance, heat intolerance and polyuria.  Genitourinary:  Negative for dyspareunia, dysuria, flank pain, frequency, genital sores, hematuria, menstrual problem, pelvic pain, urgency, vaginal bleeding, vaginal discharge and vaginal pain.  Musculoskeletal:  Negative for back pain, joint swelling and myalgias.  Skin:  Negative for rash.  Neurological:  Negative for dizziness, syncope, light-headedness, numbness and headaches.  Hematological:  Negative for adenopathy.  Psychiatric/Behavioral:  Negative for agitation, confusion, sleep disturbance and suicidal ideas. The patient is not nervous/anxious.      Objective: There were no vitals taken for this visit.   Physical Exam Constitutional:      Appearance: She is well-developed.  Genitourinary:     Vulva normal.     Right Labia: lesions.     Right Labia: No rash or tenderness.    Left Labia: lesions.     Left Labia: No tenderness or rash.    No vaginal discharge, erythema or tenderness.     Moderate vaginal atrophy present.     Right Adnexa: not tender and no mass present.    Left Adnexa: not tender and no mass present.    No cervical friability or polyp.     Uterus is not enlarged or tender.  Breasts:    Right: No mass, nipple discharge, skin change or tenderness.     Left: No mass, nipple discharge, skin change or tenderness.  Neck:     Thyroid: No thyromegaly.  Cardiovascular:      Rate and Rhythm: Normal rate and regular rhythm.     Heart sounds: Normal heart sounds. No murmur heard. Pulmonary:     Effort: Pulmonary effort is normal.     Breath sounds: Normal breath sounds.  Abdominal:     Palpations: Abdomen is soft.     Tenderness: There is no abdominal tenderness. There is no guarding or rebound.  Musculoskeletal:        General: Normal range of motion.     Cervical back: Normal range of motion.  Lymphadenopathy:     Cervical: No cervical adenopathy.  Neurological:     General: No focal deficit present.     Mental Status: She is alert and oriented  to person, place, and time.     Cranial Nerves: No cranial nerve deficit.  Skin:    General: Skin is warm and dry.  Psychiatric:        Mood and Affect: Mood normal.        Behavior: Behavior normal.        Thought Content: Thought content normal.        Judgment: Judgment normal.  Vitals reviewed.    VULVAR BIOPSY NOTE The indications for vulvar biopsy (rule out neoplasia, establish lichen sclerosus diagnosis) were reviewed.   Risks of the biopsy including pain, bleeding, infection were discussed. The patient stated understanding and agreed to undergo procedure today. Consent was signed,  time out performed.   The patient's vulva was prepped with Betadine. 1% lidocaine was injected into area of concern. A 3.5 -mm punch biopsy was done, biopsy tissue was picked up with sterile forceps and sterile scissors were used to excise the lesion.  Small bleeding was noted and hemostasis was achieved using silver nitrate sticks.  The patient tolerated the procedure well. Post-procedure instructions  (pelvic rest for one week) were given to the patient. The patient is to call with heavy bleeding, fever greater than 100.4, foul smelling vaginal discharge or other concerns.   Assessment/Plan: Encounter for annual routine gynecological examination  Cervical cancer screening - Plan: Cytology - PAP  Encounter for  screening mammogram for malignant neoplasm of breast - Plan: [facility] 3D SCREENING MAMMOGRAM BILATERAL BREAST; pt to schedule mammo  Perianal itch - Plan: Surgical pathology; looks more C/W LS now. Bx today, will f/u with results and tx.   Lichen sclerosus - Plan: Surgical pathology; bx today. Discussed tx and dx. Will f/u with bx results.   GYN counsel breast self exam, mammography screening, adequate intake of calcium and vitamin D, diet and exercise    F/U  No follow-ups on file.  Emiah Pellicano B. Mishayla Sliwinski, PA-C 10/15/2024 4:28 PM

## 2024-10-16 ENCOUNTER — Ambulatory Visit (INDEPENDENT_AMBULATORY_CARE_PROVIDER_SITE_OTHER): Admitting: Obstetrics and Gynecology

## 2024-10-16 ENCOUNTER — Encounter: Payer: Self-pay | Admitting: Obstetrics and Gynecology

## 2024-10-16 VITALS — BP 108/64 | HR 57 | Ht 67.0 in | Wt 160.0 lb

## 2024-10-16 DIAGNOSIS — L900 Lichen sclerosus et atrophicus: Secondary | ICD-10-CM

## 2024-10-16 DIAGNOSIS — Z01411 Encounter for gynecological examination (general) (routine) with abnormal findings: Secondary | ICD-10-CM | POA: Diagnosis not present

## 2024-10-16 DIAGNOSIS — Z1231 Encounter for screening mammogram for malignant neoplasm of breast: Secondary | ICD-10-CM

## 2024-10-16 DIAGNOSIS — M8580 Other specified disorders of bone density and structure, unspecified site: Secondary | ICD-10-CM

## 2024-10-16 DIAGNOSIS — Z780 Asymptomatic menopausal state: Secondary | ICD-10-CM

## 2024-10-16 DIAGNOSIS — Z01419 Encounter for gynecological examination (general) (routine) without abnormal findings: Secondary | ICD-10-CM

## 2024-10-16 DIAGNOSIS — Z1211 Encounter for screening for malignant neoplasm of colon: Secondary | ICD-10-CM

## 2024-10-16 MED ORDER — CLOBETASOL PROPIONATE 0.05 % EX OINT: TOPICAL_OINTMENT | CUTANEOUS | 0 refills | Status: AC

## 2024-10-16 NOTE — Patient Instructions (Addendum)
 I value your feedback and you entrusting [place] with your care. If you get a [facility] patient survey, I would appreciate you taking the time to let us know about your experience today. Thank you!  [facility] ([place])--[phone]

## 2024-10-17 ENCOUNTER — Ambulatory Visit: Payer: Self-pay | Admitting: Obstetrics and Gynecology

## 2024-11-04 ENCOUNTER — Other Ambulatory Visit: Payer: Self-pay | Admitting: Medical Genetics

## 2024-11-08 ENCOUNTER — Other Ambulatory Visit

## 2024-11-08 ENCOUNTER — Other Ambulatory Visit
Admission: RE | Admit: 2024-11-08 | Discharge: 2024-11-08 | Disposition: A | Discharge status: Home / Self Care | Payer: Self-pay | Source: Ambulatory Visit | Attending: Medical Genetics | Admitting: Medical Genetics

## 2024-11-10 ENCOUNTER — Ambulatory Visit
Admission: EM | Admit: 2024-11-10 | Discharge: 2024-11-10 | Disposition: A | Discharge status: Home / Self Care | Attending: Emergency Medicine | Admitting: Emergency Medicine

## 2024-11-10 DIAGNOSIS — J329 Chronic sinusitis, unspecified: Secondary | ICD-10-CM | POA: Diagnosis not present

## 2024-11-10 MED ORDER — DOXYCYCLINE HYCLATE 100 MG PO CAPS: 100.0000 mg | ORAL_CAPSULE | Freq: Two times a day (BID) | ORAL | 0 refills | Status: AC

## 2024-11-10 NOTE — Discharge Instructions (Signed)
 Take the doxycycline  as directed.  Follow up with your primary care provider or ENT.

## 2024-11-10 NOTE — ED Provider Notes (Signed)
 [name]    CSN: [id number] Arrival date & time: 11/10/24  1057      History   Chief Complaint Chief Complaint  Patient presents with   Facial Pain    HPI TYAUNA LACAZE is a 68 y.o. female.  Patient presents with recurrent frontal sinus pressure, pain, congestion, postnasal drainage, scratchy throat x 10 days.  Patient reports history of recurrent sinus infections.  She has been treating her symptoms with Sudafed and a Nettie pot.  She denies fever, cough, shortness of breath.  Patient was seen here on 10/10/2024 for recurrent sinusitis; treated with cefdinir .  She was also treated for sinusitis at this urgent care on 08/03/2024; treated with Augmentin .  She states she has an appointment with ENT next week.   HPI  Past Medical History:  Diagnosis Date   Cancer (HCC)    skin cancer   TIA (transient ischemic attack) 01/2020    Patient Active Problem List   Diagnosis Date Noted   Lichen sclerosus 03/04/2023   Perianal itch 10/11/2017   Vaginal atrophy 10/11/2017    Past Surgical History:  Procedure Laterality Date   COLONOSCOPY  12/02/2018   pt states results were fine   SKIN BIOPSY      OB History     Gravida  4   Para  3   Term  3   Preterm      AB  1   Living  3      SAB  1   IAB      Ectopic      Multiple      Live Births               Home Medications    Prior to Admission medications  Medication Sig Start Date End Date Taking? Authorizing Provider  aspirin 81 MG EC tablet Take by mouth.   Yes [provider]  CETIRIZINE HCL PO Take by mouth.   Yes [provider]  Cholecalciferol (VITAMIN D3) 50 MCG (2000 UT) capsule Take by mouth.   Yes [provider]  doxycycline (VIBRAMYCIN) 100 MG capsule Take 1 capsule (100 mg total) by mouth 2 (two) times daily. 11/10/24  Yes Corlis Burnard DEL, NP  fluticasone (FLONASE) 50 MCG/ACT nasal spray Place into the nose.   Yes [provider]   Multiple Vitamin (MULTIVITAMIN) tablet Take 1 tablet by mouth daily.   Yes [provider]  rosuvastatin (CRESTOR) 10 MG tablet Take 10 mg by mouth daily. 05/20/21  Yes [provider]  albuterol (VENTOLIN HFA) 108 (90 Base) MCG/ACT inhaler Inhale into the lungs. 07/16/21 10/16/24  [provider]  clobetasol  ointment (TEMOVATE ) 0.05 % Apply pea size amount to affected area 1-2 times weekly as maintenance 10/16/24   Copland, Alicia B, PA-C    Family History Family History  Problem Relation Age of Onset   Stroke Mother    Breast cancer Mother 40   Skin cancer Mother    Arthritis Mother    Asthma Mother    Hypertension Mother    Skin cancer Father    Arthritis Father    Prostate cancer Father    Arthritis Maternal Grandmother    Throat cancer Maternal Grandfather    Arthritis Maternal Grandfather    Hypertension Maternal Grandfather     Social History Social History[1]   Allergies   Patient has no known allergies.   Review of Systems Review of Systems  Constitutional:  Negative for  chills and fever.  HENT:  Positive for congestion, postnasal drip, rhinorrhea, sinus pressure, sinus pain and sore throat. Negative for ear pain.   Respiratory:  Negative for cough and shortness of breath.      Physical Exam Triage Vital Signs ED Triage Vitals  Encounter Vitals Group     BP 11/10/24 1249 117/75     Girls Systolic BP Percentile --      Girls Diastolic BP Percentile --      Boys Systolic BP Percentile --      Boys Diastolic BP Percentile --      Pulse Rate 11/10/24 1249 67     Resp 11/10/24 1249 18     Temp 11/10/24 1249 97.8 F (36.6 C)     Temp src --      SpO2 11/10/24 1249 99 %     Weight --      Height --      Head Circumference --      Peak Flow --      Pain Score 11/10/24 1254 8     Pain Loc --      Pain Education --      Exclude from Growth Chart --    No data found.  Updated Vital Signs BP 117/75   Pulse 67   Temp 97.8 F  (36.6 C)   Resp 18   SpO2 99%   Visual Acuity Right Eye Distance:   Left Eye Distance:   Bilateral Distance:    Right Eye Near:   Left Eye Near:    Bilateral Near:     Physical Exam Constitutional:      General: She is not in acute distress. HENT:     Right Ear: Tympanic membrane normal.     Left Ear: Tympanic membrane normal.     Nose: Congestion present.     Mouth/Throat:     Mouth: Mucous membranes are moist.     Pharynx: Oropharynx is clear.  Cardiovascular:     Rate and Rhythm: Normal rate and regular rhythm.     Heart sounds: Normal heart sounds.  Pulmonary:     Effort: Pulmonary effort is normal. No respiratory distress.     Breath sounds: Normal breath sounds.  Neurological:     Mental Status: She is alert.      UC Treatments / Results  Labs (all labs ordered are listed, but only abnormal results are displayed) Labs Reviewed - No data to display  EKG   Radiology No results found.  Procedures Procedures (including critical care time)  Medications Ordered in UC Medications - No data to display  Initial Impression / Assessment and Plan / UC Course  I have reviewed the triage vital signs and the nursing notes.  Pertinent labs & imaging results that were available during my care of the patient were reviewed by me and considered in my medical decision making (see chart for details).    Recurrent sinusitis.  Afebrile and vital signs are stable.  In the last few months, patient has been treated with Augmentin  and cefdinir  for sinus infection.  Her symptoms will resolve but then return after a few weeks.  She has an appointment scheduled with ENT next week.  Treating today with 10-day course of doxycycline.  Education provided on sinus infection.  Instructed patient to follow-up with her PCP or ENT next week.  She agrees to plan of care.  Final Clinical Impressions(s) / UC Diagnoses   Final diagnoses:  Recurrent  sinusitis     Discharge Instructions       Take the doxycycline as directed.  Follow-up with your primary care provider or ENT.     ED Prescriptions     Medication Sig Dispense Auth. Provider   doxycycline (VIBRAMYCIN) 100 MG capsule Take 1 capsule (100 mg total) by mouth 2 (two) times daily. 20 capsule Corlis Burnard DEL, NP      PDMP not reviewed this encounter.    [1]  Social History Tobacco Use   Smoking status: Never   Smokeless tobacco: Never  Vaping Use   Vaping status: Never Used  Substance Use Topics   Alcohol use: Yes   Drug use: No     Corlis Burnard DEL, NP 11/10/24 1325

## 2024-11-10 NOTE — ED Triage Notes (Signed)
 Patient to Urgent Care with complaints of right sided eyebrow/ facial pain and sinus swelling/ scratchy throat. Reports repeated sinus infections. ENT appt on Tuesday.  Symptoms x10 days. Using netti-pot/ sudafed.

## 2024-11-19 LAB — GENECONNECT MOLECULAR SCREEN: Genetic Analysis Overall Interpretation: NEGATIVE

## 2024-11-21 ENCOUNTER — Encounter: Payer: Self-pay | Admitting: Otolaryngology

## 2024-11-21 ENCOUNTER — Other Ambulatory Visit: Payer: Self-pay | Admitting: Otolaryngology

## 2024-11-21 ENCOUNTER — Ambulatory Visit
Admission: RE | Admit: 2024-11-21 | Discharge: 2024-11-21 | Disposition: A | Discharge status: Home / Self Care | Source: Ambulatory Visit | Attending: Obstetrics and Gynecology | Admitting: Obstetrics and Gynecology

## 2024-11-21 DIAGNOSIS — Z780 Asymptomatic menopausal state: Secondary | ICD-10-CM | POA: Diagnosis present

## 2024-11-21 DIAGNOSIS — M8580 Other specified disorders of bone density and structure, unspecified site: Secondary | ICD-10-CM | POA: Diagnosis present

## 2024-11-21 DIAGNOSIS — J320 Chronic maxillary sinusitis: Secondary | ICD-10-CM

## 2024-11-23 ENCOUNTER — Ambulatory Visit: Payer: Self-pay | Admitting: Obstetrics and Gynecology

## 2024-11-27 ENCOUNTER — Ambulatory Visit
Admission: RE | Admit: 2024-11-27 | Discharge: 2024-11-27 | Disposition: A | Source: Ambulatory Visit | Attending: Otolaryngology | Admitting: Otolaryngology

## 2024-11-27 DIAGNOSIS — J320 Chronic maxillary sinusitis: Secondary | ICD-10-CM
